# Patient Record
Sex: Male | Born: 1990 | Race: Black or African American | Hispanic: No | Marital: Single | Smoking: Current every day smoker
Health system: Southern US, Community
[De-identification: ages and names within clinical notes are randomized; demographics above are authoritative.]

## PROBLEM LIST (undated history)

## (undated) HISTORY — PX: APPENDECTOMY: SHX54

---

## 2019-03-17 ENCOUNTER — Other Ambulatory Visit: Payer: Self-pay

## 2019-03-17 ENCOUNTER — Encounter: Payer: Self-pay | Admitting: Emergency Medicine

## 2019-03-17 DIAGNOSIS — K920 Hematemesis: Secondary | ICD-10-CM | POA: Insufficient documentation

## 2019-03-17 DIAGNOSIS — Z5321 Procedure and treatment not carried out due to patient leaving prior to being seen by health care provider: Secondary | ICD-10-CM | POA: Insufficient documentation

## 2019-03-17 DIAGNOSIS — R1013 Epigastric pain: Secondary | ICD-10-CM | POA: Insufficient documentation

## 2019-03-17 LAB — CBC
HCT: 43.8 % (ref 39.0–52.0)
Hemoglobin: 15 g/dL (ref 13.0–17.0)
MCH: 30.9 pg (ref 26.0–34.0)
MCHC: 34.2 g/dL (ref 30.0–36.0)
MCV: 90.1 fL (ref 80.0–100.0)
Platelets: 213 10*3/uL (ref 150–400)
RBC: 4.86 MIL/uL (ref 4.22–5.81)
RDW: 11.8 % (ref 11.5–15.5)
WBC: 9.6 10*3/uL (ref 4.0–10.5)
nRBC: 0 % (ref 0.0–0.2)

## 2019-03-17 LAB — LIPASE, BLOOD: Lipase: 22 U/L (ref 11–51)

## 2019-03-17 LAB — COMPREHENSIVE METABOLIC PANEL
ALT: 12 U/L (ref 0–44)
AST: 15 U/L (ref 15–41)
Albumin: 3.9 g/dL (ref 3.5–5.0)
Alkaline Phosphatase: 47 U/L (ref 38–126)
Anion gap: 7 (ref 5–15)
BUN: 10 mg/dL (ref 6–20)
CO2: 25 mmol/L (ref 22–32)
Calcium: 9.1 mg/dL (ref 8.9–10.3)
Chloride: 101 mmol/L (ref 98–111)
Creatinine, Ser: 1.16 mg/dL (ref 0.61–1.24)
GFR calc Af Amer: >60 mL/min (ref >60)
GFR calc non Af Amer: >60 mL/min (ref >60)
Glucose, Bld: 93 mg/dL (ref 70–99)
Potassium: 3.4 mmol/L — ABNORMAL LOW (ref 3.5–5.1)
Sodium: 133 mmol/L — ABNORMAL LOW (ref 135–145)
Total Bilirubin: 1.6 mg/dL — ABNORMAL HIGH (ref 0.3–1.2)
Total Protein: 7.2 g/dL (ref 6.5–8.1)

## 2019-03-17 MED ORDER — SODIUM CHLORIDE 0.9% FLUSH: 3.0000 mL | Freq: Once | INTRAVENOUS | Status: DC

## 2019-03-17 MED ORDER — ONDANSETRON 4 MG PO TBDP
4.0000 mg | ORAL_TABLET | Freq: Once | ORAL | Status: AC | PRN
  Administered 2019-03-17: 4 mg via ORAL
  Filled 2019-03-17: qty 1

## 2019-03-17 MED ORDER — LIDOCAINE HCL 1 % IJ SOLN
0.50 | INTRAMUSCULAR | Status: DC
Start: ? — End: 2019-03-17

## 2019-03-17 NOTE — ED Triage Notes (Signed)
Pt presents to ER from home with complaints of epigastric pain for 4 days , accompanied by nausea and vomiting, reports last BM 3 days ago denies any diarrhea. Pt talks in complete sentences no respiratory distress noted

## 2019-03-18 ENCOUNTER — Emergency Department
Admission: EM | Admit: 2019-03-18 | Discharge: 2019-03-18 | Disposition: A | Discharge status: Home / Self Care | Payer: Self-pay | Attending: Emergency Medicine | Admitting: Emergency Medicine

## 2019-03-18 ENCOUNTER — Encounter (HOSPITAL_COMMUNITY): Payer: Self-pay

## 2019-03-18 ENCOUNTER — Emergency Department (HOSPITAL_COMMUNITY)
Admission: EM | Admit: 2019-03-18 | Discharge: 2019-03-18 | Disposition: A | Discharge status: Home / Self Care | Payer: Self-pay | Attending: Emergency Medicine | Admitting: Emergency Medicine

## 2019-03-18 DIAGNOSIS — R110 Nausea: Secondary | ICD-10-CM | POA: Insufficient documentation

## 2019-03-18 DIAGNOSIS — R1084 Generalized abdominal pain: Secondary | ICD-10-CM | POA: Insufficient documentation

## 2019-03-18 DIAGNOSIS — R531 Weakness: Secondary | ICD-10-CM | POA: Insufficient documentation

## 2019-03-18 DIAGNOSIS — K5900 Constipation, unspecified: Secondary | ICD-10-CM | POA: Insufficient documentation

## 2019-03-18 DIAGNOSIS — R112 Nausea with vomiting, unspecified: Secondary | ICD-10-CM | POA: Insufficient documentation

## 2019-03-18 DIAGNOSIS — F17210 Nicotine dependence, cigarettes, uncomplicated: Secondary | ICD-10-CM | POA: Insufficient documentation

## 2019-03-18 LAB — COMPREHENSIVE METABOLIC PANEL
ALT: 14 U/L (ref 0–44)
AST: 13 U/L — ABNORMAL LOW (ref 15–41)
Albumin: 4.1 g/dL (ref 3.5–5.0)
Alkaline Phosphatase: 45 U/L (ref 38–126)
Anion gap: 11 (ref 5–15)
BUN: 10 mg/dL (ref 6–20)
CO2: 22 mmol/L (ref 22–32)
Calcium: 9.3 mg/dL (ref 8.9–10.3)
Chloride: 101 mmol/L (ref 98–111)
Creatinine, Ser: 1.14 mg/dL (ref 0.61–1.24)
GFR calc Af Amer: >60 mL/min (ref >60)
GFR calc non Af Amer: >60 mL/min (ref >60)
Glucose, Bld: 95 mg/dL (ref 70–99)
Potassium: 3.2 mmol/L — ABNORMAL LOW (ref 3.5–5.1)
Sodium: 134 mmol/L — ABNORMAL LOW (ref 135–145)
Total Bilirubin: 1.2 mg/dL (ref 0.3–1.2)
Total Protein: 7.4 g/dL (ref 6.5–8.1)

## 2019-03-18 LAB — CBC
HCT: 44 % (ref 39.0–52.0)
Hemoglobin: 14.9 g/dL (ref 13.0–17.0)
MCH: 30.9 pg (ref 26.0–34.0)
MCHC: 33.9 g/dL (ref 30.0–36.0)
MCV: 91.3 fL (ref 80.0–100.0)
Platelets: 218 10*3/uL (ref 150–400)
RBC: 4.82 MIL/uL (ref 4.22–5.81)
RDW: 11.8 % (ref 11.5–15.5)
WBC: 10.6 10*3/uL — ABNORMAL HIGH (ref 4.0–10.5)
nRBC: 0 % (ref 0.0–0.2)

## 2019-03-18 LAB — LIPASE, BLOOD: Lipase: 19 U/L (ref 11–51)

## 2019-03-18 LAB — LACTIC ACID, PLASMA: Lactic Acid, Venous: 1.4 mmol/L (ref 0.5–1.9)

## 2019-03-18 MED ORDER — SODIUM CHLORIDE 0.9% FLUSH
3.0000 mL | Freq: Once | INTRAVENOUS | Status: AC
  Administered 2019-03-18: 3 mL via INTRAVENOUS

## 2019-03-18 MED ORDER — DIPHENHYDRAMINE HCL 50 MG/ML IJ SOLN
12.5000 mg | Freq: Once | INTRAMUSCULAR | Status: AC
  Administered 2019-03-18: 03:00:00 12.5 mg via INTRAVENOUS
  Filled 2019-03-18: qty 1

## 2019-03-18 MED ORDER — METOCLOPRAMIDE HCL 5 MG/ML IJ SOLN
10.0000 mg | Freq: Once | INTRAMUSCULAR | Status: AC
  Administered 2019-03-18: 10 mg via INTRAVENOUS
  Filled 2019-03-18: qty 2

## 2019-03-18 MED ORDER — SODIUM CHLORIDE 0.9 % IV BOLUS
1000.0000 mL | Freq: Once | INTRAVENOUS | Status: AC
  Administered 2019-03-18: 1000 mL via INTRAVENOUS

## 2019-03-18 NOTE — ED Triage Notes (Addendum)
Pt reports abdominal pain and vomiting x4 days. States that he recently had his appendix removed and there was a complication involving his large intestine. States that he has been unable to keep anything down. Denies diarrhea.

## 2019-03-18 NOTE — ED Provider Notes (Signed)
[facility]-EMERGENCY DEPT Provider Note   CSN: [id number] Arrival date & time: 03/18/19  0008     History Chief Complaint  Patient presents with  . Abdominal Pain    Ethan Schultz is a 29 y.o. male.  Patient to ED with complaint of epigastric and right sided abdominal pain, nausea, vomiting, and constipation for 4 days. "My mouth is so dry, I know I'm dehydrated." He reports no BM in 4 days. No fever. No SOB, CP. He reports decreased urination and decreased PO intake secondary to nausea. He states he had an appendectomy years ago in [place] during which there was a complication, possible injury to large bowel. He is concerned for bowel obstruction.   The history is provided by the patient. No language interpreter was used.  Abdominal Pain Associated symptoms: constipation, nausea and vomiting   Associated symptoms: no chest pain, no fever and no shortness of breath        History reviewed. No pertinent past medical history.  There are no problems to display for this patient.   Past Surgical History:  Procedure Laterality Date  . APPENDECTOMY         History reviewed. No pertinent family history.  Social History   Tobacco Use  . Smoking status: Current Every Day Smoker  Substance Use Topics  . Alcohol use: Not Currently  . Drug use: Not Currently    Home Medications Prior to Admission medications   Medication Sig Start Date End Date Taking? Authorizing Provider  ondansetron (ZOFRAN-ODT) 4 MG disintegrating tablet Take 4 mg by mouth every 8 (eight) hours as needed for nausea or vomiting.   Yes [provider]    Allergies    Penicillins and Orange oil  Review of Systems   Review of Systems  Constitutional: Negative for fever.  HENT: Negative.   Respiratory: Negative.  Negative for shortness of breath.   Cardiovascular: Negative for chest pain.  Gastrointestinal: Positive for abdominal pain, constipation, nausea and vomiting.   Genitourinary: Positive for decreased urine volume.  Musculoskeletal: Negative for back pain.  Neurological: Positive for weakness (Generalized).    Physical Exam Updated Vital Signs BP 133/88 (BP Location: Left Arm)   Pulse 99   Temp 98.2 F (36.8 C) (Oral)   Resp (!) 22   SpO2 100%   Physical Exam Vitals and nursing note reviewed.  Constitutional:      Appearance: He is well-developed.  HENT:     Head: Normocephalic.     Mouth/Throat:     Comments: Dry oral mucosa. Cardiovascular:     Rate and Rhythm: Normal rate and regular rhythm.     Heart sounds: No murmur.  Pulmonary:     Effort: Pulmonary effort is normal.     Breath sounds: Normal breath sounds. No wheezing, rhonchi or rales.  Abdominal:     General: Bowel sounds are normal. There is no distension.     Palpations: Abdomen is soft.     Tenderness: There is generalized abdominal tenderness. There is no right CVA tenderness, guarding or rebound.     Hernia: No hernia is present.     Comments: Well healed surgical scar in RLQ.  Musculoskeletal:        General: Normal range of motion.     Cervical back: Normal range of motion and neck supple.  Skin:    General: Skin is warm and dry.     Findings: No rash.  Neurological:     Mental  Status: He is alert and oriented to person, place, and time.     ED Results / Procedures / Treatments   Labs (all labs ordered are listed, but only abnormal results are displayed) Labs Reviewed  COMPREHENSIVE METABOLIC PANEL - Abnormal; Notable for the following components:      Result Value   Sodium 134 (*)    Potassium 3.2 (*)    AST 13 (*)    All other components within normal limits  CBC - Abnormal; Notable for the following components:   WBC 10.6 (*)    All other components within normal limits  LIPASE, BLOOD  URINALYSIS, ROUTINE W REFLEX MICROSCOPIC  LACTIC ACID, PLASMA  LACTIC ACID, PLASMA   Results for orders placed or performed during the hospital encounter of  03/18/19  Lipase, blood  Result Value Ref Range   Lipase 19 11 - 51 U/L  Comprehensive metabolic panel  Result Value Ref Range   Sodium 134 (L) 135 - 145 mmol/L   Potassium 3.2 (L) 3.5 - 5.1 mmol/L   Chloride 101 98 - 111 mmol/L   CO2 22 22 - 32 mmol/L   Glucose, Bld 95 70 - 99 mg/dL   BUN 10 6 - 20 mg/dL   Creatinine, Ser 1.14 0.61 - 1.24 mg/dL   Calcium 9.3 8.9 - 10.3 mg/dL   Total Protein 7.4 6.5 - 8.1 g/dL   Albumin 4.1 3.5 - 5.0 g/dL   AST 13 (L) 15 - 41 U/L   ALT 14 0 - 44 U/L   Alkaline Phosphatase 45 38 - 126 U/L   Total Bilirubin 1.2 0.3 - 1.2 mg/dL   GFR calc non Af Amer >60 >60 mL/min   GFR calc Af Amer >60 >60 mL/min   Anion gap 11 5 - 15  CBC  Result Value Ref Range   WBC 10.6 (H) 4.0 - 10.5 K/uL   RBC 4.82 4.22 - 5.81 MIL/uL   Hemoglobin 14.9 13.0 - 17.0 g/dL   HCT 44.0 39.0 - 52.0 %   MCV 91.3 80.0 - 100.0 fL   MCH 30.9 26.0 - 34.0 pg   MCHC 33.9 30.0 - 36.0 g/dL   RDW 11.8 11.5 - 15.5 %   Platelets 218 150 - 400 K/uL   nRBC 0.0 0.0 - 0.2 %  Lactic acid, plasma  Result Value Ref Range   Lactic Acid, Venous 1.4 0.5 - 1.9 mmol/L    EKG None  Radiology No results found.  Procedures Procedures (including critical care time)  Medications Ordered in ED Medications  sodium chloride 0.9 % bolus 1,000 mL (has no administration in time range)  metoCLOPramide (REGLAN) injection 10 mg (has no administration in time range)  diphenhydrAMINE (BENADRYL) injection 12.5 mg (has no administration in time range)  sodium chloride flush (NS) 0.9 % injection 3 mL (3 mLs Intravenous Given 03/18/19 0110)    ED Course  I have reviewed the triage vital signs and the nursing notes.  Pertinent labs & imaging results that were available during my care of the patient were reviewed by me and considered in my medical decision making (see chart for details).    MDM Rules/Calculators/A&P                      Patient to ED with abdominal pain, N, V, constipation as  detailed in HPI.   Chart reviewed: The patient has been seen at multiple [place] hospitals in the last 3 days: 03/15/19 - [facility] - CC abd pain, N, V, diarrhea, no decreased urination. Labs unremarkable, CT abd/pel w/o acute finding. Dx with Rx Phenergan  03/16/19 - [facility]: CC abd pain vomiting, w/bloody emesis. Labs unremarkable, CT abd/pel repeated and is negative. D/ch with Rx Bentyl, phenergan, prilosec. Given GI referral.  03/17/19 - [facility] - left prior to being seen and presented to [facility] for current encounter.   Patient given IV Reglan and benadryl. Labs are reassuring. No further imaging is felt indicated.   On re-evaluation the patient is resting comfortably. He is strongly encouraged to follow up with GI as has been recommended.    Final Clinical Impression(s) / ED Diagnoses Final diagnoses:  None   1. Generalized abdominal pain 2. nausea  Rx / DC Orders ED Discharge Orders    None       Elpidio Anis, PA-C 03/23/19 Jeannie Fend, MD 03/23/19 [phone]

## 2019-03-18 NOTE — Discharge Instructions (Addendum)
Take the medications you were previously prescribed and call the gastroenterologist you were referred to by [facility] on Monday to schedule an appointment.

## 2019-05-02 ENCOUNTER — Other Ambulatory Visit: Payer: Self-pay

## 2019-05-02 ENCOUNTER — Encounter: Payer: Self-pay | Admitting: Emergency Medicine

## 2019-05-02 ENCOUNTER — Emergency Department
Admission: EM | Admit: 2019-05-02 | Discharge: 2019-05-02 | Disposition: A | Discharge status: Home / Self Care | Payer: Self-pay | Attending: Emergency Medicine | Admitting: Emergency Medicine

## 2019-05-02 ENCOUNTER — Emergency Department: Payer: Self-pay

## 2019-05-02 DIAGNOSIS — R1115 Cyclical vomiting syndrome unrelated to migraine: Secondary | ICD-10-CM | POA: Insufficient documentation

## 2019-05-02 DIAGNOSIS — F17200 Nicotine dependence, unspecified, uncomplicated: Secondary | ICD-10-CM | POA: Insufficient documentation

## 2019-05-02 DIAGNOSIS — Z9089 Acquired absence of other organs: Secondary | ICD-10-CM | POA: Insufficient documentation

## 2019-05-02 DIAGNOSIS — R1013 Epigastric pain: Secondary | ICD-10-CM | POA: Insufficient documentation

## 2019-05-02 DIAGNOSIS — F1290 Cannabis use, unspecified, uncomplicated: Secondary | ICD-10-CM | POA: Insufficient documentation

## 2019-05-02 LAB — COMPREHENSIVE METABOLIC PANEL
ALT: 18 U/L (ref 0–44)
AST: 24 U/L (ref 15–41)
Albumin: 4.6 g/dL (ref 3.5–5.0)
Alkaline Phosphatase: 46 U/L (ref 38–126)
Anion gap: 14 (ref 5–15)
BUN: 16 mg/dL (ref 6–20)
CO2: 19 mmol/L — ABNORMAL LOW (ref 22–32)
Calcium: 9.5 mg/dL (ref 8.9–10.3)
Chloride: 108 mmol/L (ref 98–111)
Creatinine, Ser: 1.23 mg/dL (ref 0.61–1.24)
GFR calc Af Amer: >60 mL/min (ref >60)
GFR calc non Af Amer: >60 mL/min (ref >60)
Glucose, Bld: 179 mg/dL — ABNORMAL HIGH (ref 70–99)
Potassium: 3.7 mmol/L (ref 3.5–5.1)
Sodium: 141 mmol/L (ref 135–145)
Total Bilirubin: 0.6 mg/dL (ref 0.3–1.2)
Total Protein: 7.8 g/dL (ref 6.5–8.1)

## 2019-05-02 LAB — LIPASE, BLOOD: Lipase: 29 U/L (ref 11–51)

## 2019-05-02 LAB — URINE DRUG SCREEN, QUALITATIVE (ARMC ONLY)
Amphetamines, Ur Screen: NOT DETECTED
Barbiturates, Ur Screen: NOT DETECTED
Benzodiazepine, Ur Scrn: NOT DETECTED
Cannabinoid 50 Ng, Ur ~~LOC~~: POSITIVE — AB
Cocaine Metabolite,Ur ~~LOC~~: NOT DETECTED
MDMA (Ecstasy)Ur Screen: NOT DETECTED
Methadone Scn, Ur: NOT DETECTED
Opiate, Ur Screen: POSITIVE — AB
Phencyclidine (PCP) Ur S: NOT DETECTED
Tricyclic, Ur Screen: NOT DETECTED

## 2019-05-02 LAB — URINALYSIS, COMPLETE (UACMP) WITH MICROSCOPIC
Bacteria, UA: NONE SEEN
Bilirubin Urine: NEGATIVE
Glucose, UA: 50 mg/dL — AB
Hgb urine dipstick: NEGATIVE
Ketones, ur: 20 mg/dL — AB
Leukocytes,Ua: NEGATIVE
Nitrite: NEGATIVE
Protein, ur: 30 mg/dL — AB
Specific Gravity, Urine: 1.014 (ref 1.005–1.030)
pH: 9 — ABNORMAL HIGH (ref 5.0–8.0)

## 2019-05-02 LAB — CBC
HCT: 42.4 % (ref 39.0–52.0)
Hemoglobin: 14.4 g/dL (ref 13.0–17.0)
MCH: 31.4 pg (ref 26.0–34.0)
MCHC: 34 g/dL (ref 30.0–36.0)
MCV: 92.6 fL (ref 80.0–100.0)
Platelets: 255 10*3/uL (ref 150–400)
RBC: 4.58 MIL/uL (ref 4.22–5.81)
RDW: 11.3 % — ABNORMAL LOW (ref 11.5–15.5)
WBC: 9.1 10*3/uL (ref 4.0–10.5)
nRBC: 0 % (ref 0.0–0.2)

## 2019-05-02 MED ORDER — ONDANSETRON HCL 4 MG/2ML IJ SOLN
4.0000 mg | Freq: Once | INTRAMUSCULAR | Status: AC
  Administered 2019-05-02: 4 mg via INTRAVENOUS
  Filled 2019-05-02: qty 2

## 2019-05-02 MED ORDER — IOHEXOL 300 MG/ML  SOLN
100.0000 mL | Freq: Once | INTRAMUSCULAR | Status: AC | PRN
  Administered 2019-05-02: 100 mL via INTRAVENOUS

## 2019-05-02 MED ORDER — ONDANSETRON 4 MG PO TBDP: 4.0000 mg | ORAL_TABLET | Freq: Three times a day (TID) | ORAL | 0 refills | Status: DC | PRN

## 2019-05-02 MED ORDER — LACTATED RINGERS IV BOLUS
1000.0000 mL | Freq: Once | INTRAVENOUS | Status: AC
  Administered 2019-05-02: 1000 mL via INTRAVENOUS

## 2019-05-02 MED ORDER — SODIUM CHLORIDE 0.9% FLUSH
3.0000 mL | Freq: Once | INTRAVENOUS | Status: AC
  Administered 2019-05-02: 3 mL via INTRAVENOUS

## 2019-05-02 MED ORDER — DROPERIDOL 2.5 MG/ML IJ SOLN
2.5000 mg | Freq: Once | INTRAMUSCULAR | Status: AC
  Administered 2019-05-02: 2.5 mg via INTRAVENOUS
  Filled 2019-05-02: qty 2

## 2019-05-02 MED ORDER — MORPHINE SULFATE (PF) 4 MG/ML IV SOLN
4.0000 mg | Freq: Once | INTRAVENOUS | Status: AC
  Administered 2019-05-02: 4 mg via INTRAVENOUS
  Filled 2019-05-02: qty 1

## 2019-05-02 NOTE — ED Triage Notes (Signed)
Pt to ER via EMS from home with c/o acute onset abdominal pain.  Pt reports previous surgery with complications.  Pt reports chronic abdominal problems since that time.  Pt report n/v/d with pain at this time.  Pt with active vomiting on arrival. Dr. Larinda Buttery to room to assess pt.

## 2019-05-02 NOTE — ED Provider Notes (Signed)
[facility] Emergency Department Provider Note   ____________________________________________   First MD Initiated Contact with Patient 05/02/19 1106     (approximate)  I have reviewed the triage vital signs and the nursing notes.   HISTORY  Chief Complaint Abdominal Pain and Emesis    HPI Ethan Schultz is a 29 y.o. male with past medical history of recurrent abdominal pain presents to the ED complaining of abdominal pain, nausea, and vomiting.  History is limited due to ongoing vomiting.  Per EMS, patient woke up this morning with severe diffuse abdominal pain as well as multiple episodes of emesis.  Patient states he has not been able to keep anything down since this morning and he has severe pain across much of his abdomen, similar to prior episodes but more severe.  He does state he had an episode of diarrhea earlier today and has not noticed any blood in his emesis or stool.  He was feeling well yesterday with no recent fevers, cough, chest pain, or shortness of breath.  He attributes his episodes of abdominal pain to an appendectomy many years ago when he reports having a bowel injury.        History reviewed. No pertinent past medical history.  There are no problems to display for this patient.   Past Surgical History:  Procedure Laterality Date  . APPENDECTOMY      Prior to Admission medications   Medication Sig Start Date End Date Taking? Authorizing Provider  ondansetron (ZOFRAN ODT) 4 MG disintegrating tablet Take 1 tablet (4 mg total) by mouth every 8 (eight) hours as needed for nausea or vomiting. 05/02/19   Blake Divine, MD    Allergies Penicillins and Orange oil  History reviewed. No pertinent family history.  Social History Social History   Tobacco Use  . Smoking status: Current Every Day Smoker  . Smokeless tobacco: Never Used  Substance Use Topics  . Alcohol use: Not Currently  . Drug use: Not Currently    Review of  Systems  Constitutional: No fever/chills Eyes: No visual changes. ENT: No sore throat. Cardiovascular: Denies chest pain. Respiratory: Denies shortness of breath. Gastrointestinal: Positive for abdominal pain.  Positive for nausea and vomiting.  No diarrhea.  No constipation. Genitourinary: Negative for dysuria. Musculoskeletal: Negative for back pain. Skin: Negative for rash. Neurological: Negative for headaches, focal weakness or numbness.  ____________________________________________   PHYSICAL EXAM:  VITAL SIGNS: ED Triage Vitals  Enc Vitals Group     BP --      Pulse --      Resp --      Temp --      Temp src --      SpO2 --      Weight 05/02/19 1106 210 lb (95.3 kg)     Height 05/02/19 1106 5\' 10"  (1.778 m)     Head Circumference --      Peak Flow --      Pain Score 05/02/19 1105 10     Pain Loc --      Pain Edu? --      Excl. in [place]? --     Constitutional: Alert and oriented. Eyes: Conjunctivae are normal. Head: Atraumatic. Nose: No congestion/rhinnorhea. Mouth/Throat: Mucous membranes are moist. Neck: Normal ROM Cardiovascular: Tachycardic, regular rhythm. Grossly normal heart sounds. Respiratory: Normal respiratory effort.  No retractions. Lungs CTAB. Gastrointestinal: Soft and diffusely tender to palpation with no rebound or guarding. No distention. Genitourinary: deferred Musculoskeletal: No lower extremity  tenderness nor edema. Neurologic:  Normal speech and language. No gross focal neurologic deficits are appreciated. Skin:  Skin is warm, dry and intact. No rash noted. Psychiatric: Mood and affect are normal. Speech and behavior are normal.  ____________________________________________   LABS (all labs ordered are listed, but only abnormal results are displayed)  Labs Reviewed  COMPREHENSIVE METABOLIC PANEL - Abnormal; Notable for the following components:      Result Value   CO2 19 (*)    Glucose, Bld 179 (*)    All other components within  normal limits  CBC - Abnormal; Notable for the following components:   RDW 11.3 (*)    All other components within normal limits  URINALYSIS, COMPLETE (UACMP) WITH MICROSCOPIC - Abnormal; Notable for the following components:   Color, Urine YELLOW (*)    APPearance CLEAR (*)    pH 9.0 (*)    Glucose, UA 50 (*)    Ketones, ur 20 (*)    Protein, ur 30 (*)    All other components within normal limits  URINE DRUG SCREEN, QUALITATIVE ([facility] ONLY) - Abnormal; Notable for the following components:   Opiate, Ur Screen POSITIVE (*)    Cannabinoid 50 Ng, Ur [place] POSITIVE (*)    All other components within normal limits  LIPASE, BLOOD    PROCEDURES  Procedure(s) performed (including Critical Care):  Procedures   ED ECG REPORT I, Chesley Noon, the attending physician, personally viewed and interpreted this ECG.   Date: 05/02/2019  EKG Time: 11:12  Rate: 62  Rhythm: normal sinus rhythm  Axis: Normal  Intervals:none  ST&T Change: Benign early repolarization    ____________________________________________   INITIAL IMPRESSION / ASSESSMENT AND PLAN / ED COURSE       29 year old male with history of recurrent abdominal pain presents to the ED complaining of severe diffuse pain as well as persistent nausea and vomiting since waking up this morning.  Reviewing care everywhere, he has had ED visits for similar pain in January at [facility] and [facility] where CT and labs were unremarkable.  Given chronic complaints, we will hold off on CT scan for now and check labs, treat symptomatically.  Patient continues to have significant abdominal pain with active vomiting despite morphine and Zofran.  We will treat with droperidol and check CT scan of abdomen/pelvis.  CT scan is negative for acute process, vomiting has resolved following dose of droperidol, and patient states pain is improved.  He does admit to smoking marijuana regularly and I have advised him that this could be contributing to his  episodes of abdominal pain and vomiting.  He was counseled to establish care with a PCP and to return to the ED for new or worsening symptoms.  Patient agrees with plan.      ____________________________________________   FINAL CLINICAL IMPRESSION(S) / ED DIAGNOSES  Final diagnoses:  Epigastric pain  Cyclic vomiting syndrome     ED Discharge Orders         Ordered    ondansetron (ZOFRAN ODT) 4 MG disintegrating tablet  Every 8 hours PRN     05/02/19 1335           Note:  This document was prepared using Dragon voice recognition software and may include unintentional dictation errors.   Chesley Noon, MD 05/02/19 1359

## 2019-10-11 ENCOUNTER — Emergency Department: Payer: PRIVATE HEALTH INSURANCE

## 2019-10-11 ENCOUNTER — Other Ambulatory Visit: Payer: Self-pay

## 2019-10-11 ENCOUNTER — Encounter: Payer: Self-pay | Admitting: Radiology

## 2019-10-11 ENCOUNTER — Emergency Department
Admission: EM | Admit: 2019-10-11 | Discharge: 2019-10-11 | Disposition: A | Discharge status: Home / Self Care | Payer: PRIVATE HEALTH INSURANCE | Attending: Student in an Organized Health Care Education/Training Program | Admitting: Student in an Organized Health Care Education/Training Program

## 2019-10-11 DIAGNOSIS — R1084 Generalized abdominal pain: Secondary | ICD-10-CM | POA: Insufficient documentation

## 2019-10-11 DIAGNOSIS — Z79899 Other long term (current) drug therapy: Secondary | ICD-10-CM | POA: Insufficient documentation

## 2019-10-11 DIAGNOSIS — R112 Nausea with vomiting, unspecified: Secondary | ICD-10-CM | POA: Insufficient documentation

## 2019-10-11 DIAGNOSIS — F17200 Nicotine dependence, unspecified, uncomplicated: Secondary | ICD-10-CM | POA: Insufficient documentation

## 2019-10-11 LAB — URINE DRUG SCREEN, QUALITATIVE (ARMC ONLY)
Amphetamines, Ur Screen: NOT DETECTED
Barbiturates, Ur Screen: NOT DETECTED
Benzodiazepine, Ur Scrn: NOT DETECTED
Cannabinoid 50 Ng, Ur ~~LOC~~: POSITIVE — AB
Cocaine Metabolite,Ur ~~LOC~~: NOT DETECTED
MDMA (Ecstasy)Ur Screen: NOT DETECTED
Methadone Scn, Ur: NOT DETECTED
Opiate, Ur Screen: POSITIVE — AB
Phencyclidine (PCP) Ur S: NOT DETECTED
Tricyclic, Ur Screen: NOT DETECTED

## 2019-10-11 LAB — CBC WITH DIFFERENTIAL/PLATELET
Abs Immature Granulocytes: 0.02 10*3/uL (ref 0.00–0.07)
Basophils Absolute: 0 10*3/uL (ref 0.0–0.1)
Basophils Relative: 0 %
Eosinophils Absolute: 0 10*3/uL (ref 0.0–0.5)
Eosinophils Relative: 0 %
HCT: 40.7 % (ref 39.0–52.0)
Hemoglobin: 14 g/dL (ref 13.0–17.0)
Immature Granulocytes: 0 %
Lymphocytes Relative: 15 %
Lymphs Abs: 1.3 10*3/uL (ref 0.7–4.0)
MCH: 31.6 pg (ref 26.0–34.0)
MCHC: 34.4 g/dL (ref 30.0–36.0)
MCV: 91.9 fL (ref 80.0–100.0)
Monocytes Absolute: 0.3 10*3/uL (ref 0.1–1.0)
Monocytes Relative: 3 %
Neutro Abs: 7.4 10*3/uL (ref 1.7–7.7)
Neutrophils Relative %: 82 %
Platelets: 174 10*3/uL (ref 150–400)
RBC: 4.43 MIL/uL (ref 4.22–5.81)
RDW: 11.9 % (ref 11.5–15.5)
WBC: 9 10*3/uL (ref 4.0–10.5)
nRBC: 0 % (ref 0.0–0.2)

## 2019-10-11 LAB — COMPREHENSIVE METABOLIC PANEL
ALT: 10 U/L (ref 0–44)
AST: 19 U/L (ref 15–41)
Albumin: 3.8 g/dL (ref 3.5–5.0)
Alkaline Phosphatase: 42 U/L (ref 38–126)
Anion gap: 12 (ref 5–15)
BUN: 16 mg/dL (ref 6–20)
CO2: 21 mmol/L — ABNORMAL LOW (ref 22–32)
Calcium: 8.5 mg/dL — ABNORMAL LOW (ref 8.9–10.3)
Chloride: 105 mmol/L (ref 98–111)
Creatinine, Ser: 1 mg/dL (ref 0.61–1.24)
GFR calc Af Amer: >60 mL/min (ref >60)
GFR calc non Af Amer: >60 mL/min (ref >60)
Glucose, Bld: 191 mg/dL — ABNORMAL HIGH (ref 70–99)
Potassium: 3.6 mmol/L (ref 3.5–5.1)
Sodium: 138 mmol/L (ref 135–145)
Total Bilirubin: 1.1 mg/dL (ref 0.3–1.2)
Total Protein: 6.2 g/dL — ABNORMAL LOW (ref 6.5–8.1)

## 2019-10-11 LAB — URINALYSIS, COMPLETE (UACMP) WITH MICROSCOPIC
Bacteria, UA: NONE SEEN
Bilirubin Urine: NEGATIVE
Glucose, UA: 150 mg/dL — AB
Hgb urine dipstick: NEGATIVE
Ketones, ur: 20 mg/dL — AB
Leukocytes,Ua: NEGATIVE
Nitrite: NEGATIVE
Protein, ur: NEGATIVE mg/dL
Specific Gravity, Urine: 1.018 (ref 1.005–1.030)
Squamous Epithelial / HPF: NONE SEEN (ref 0–5)
pH: 8 (ref 5.0–8.0)

## 2019-10-11 LAB — LIPASE, BLOOD: Lipase: 26 U/L (ref 11–51)

## 2019-10-11 MED ORDER — SODIUM CHLORIDE 0.9 % IV BOLUS
1000.0000 mL | Freq: Once | INTRAVENOUS | Status: AC
  Administered 2019-10-11: 1000 mL via INTRAVENOUS

## 2019-10-11 MED ORDER — IOHEXOL 9 MG/ML PO SOLN: 500.0000 mL | ORAL | Status: DC

## 2019-10-11 MED ORDER — PROMETHAZINE HCL 25 MG/ML IJ SOLN
25.0000 mg | Freq: Four times a day (QID) | INTRAMUSCULAR | Status: DC | PRN
  Administered 2019-10-11: 25 mg via INTRAVENOUS
  Filled 2019-10-11 (×2): qty 1

## 2019-10-11 MED ORDER — LACTATED RINGERS IV BOLUS
1000.0000 mL | Freq: Once | INTRAVENOUS | Status: AC
  Administered 2019-10-11: 1000 mL via INTRAVENOUS

## 2019-10-11 MED ORDER — MORPHINE SULFATE (PF) 4 MG/ML IV SOLN
4.0000 mg | INTRAVENOUS | Status: DC | PRN
  Administered 2019-10-11 (×2): 4 mg via INTRAVENOUS
  Filled 2019-10-11 (×2): qty 1

## 2019-10-11 MED ORDER — IOHEXOL 300 MG/ML  SOLN
100.0000 mL | Freq: Once | INTRAMUSCULAR | Status: AC | PRN
  Administered 2019-10-11: 100 mL via INTRAVENOUS

## 2019-10-11 MED ORDER — PROMETHAZINE HCL 25 MG PO TABS: 25.0000 mg | ORAL_TABLET | Freq: Three times a day (TID) | ORAL | 0 refills | Status: DC | PRN

## 2019-10-11 NOTE — ED Notes (Signed)
Pt to CT

## 2019-10-11 NOTE — ED Provider Notes (Signed)
[facility] Emergency Department Provider Note    First MD Initiated Contact with Patient 10/11/19 [phone]     (approximate)  I have reviewed the triage vital signs and the nursing notes.   HISTORY  Chief Complaint Abdominal Pain    HPI Ethan Schultz is a 29 y.o. male presents to the ER with severe generalized abdominal pain is crampy in nature associated with nausea and vomiting.  Is nonbilious none bloody emesis.  Symptoms started around 4 AM this morning.  Denies any fevers.  No aggravating factors.  Denies any pain radiating through to his back.  He status post appendectomy several years ago.  He states that the pain feels just as severe as his appendicitis.    History reviewed. No pertinent past medical history. No family history on file. Past Surgical History:  Procedure Laterality Date  . APPENDECTOMY     There are no problems to display for this patient.     Prior to Admission medications   Medication Sig Start Date End Date Taking? Authorizing Provider  ondansetron (ZOFRAN ODT) 4 MG disintegrating tablet Take 1 tablet (4 mg total) by mouth every 8 (eight) hours as needed for nausea or vomiting. 05/02/19   Chesley Noon, MD  promethazine (PHENERGAN) 25 MG tablet Take 1 tablet (25 mg total) by mouth every 8 (eight) hours as needed for nausea or vomiting. 10/11/19   Willy Eddy, MD    Allergies Penicillins and Orange oil    Social History Social History   Tobacco Use  . Smoking status: Current Every Day Smoker  . Smokeless tobacco: Never Used  Substance Use Topics  . Alcohol use: Not Currently  . Drug use: Not Currently    Review of Systems Patient denies headaches, rhinorrhea, blurry vision, numbness, shortness of breath, chest pain, edema, cough, abdominal pain, nausea, vomiting, diarrhea, dysuria, fevers, rashes or hallucinations unless otherwise stated above in HPI. ____________________________________________   PHYSICAL  EXAM:  VITAL SIGNS: Vitals:   10/11/19 1030 10/11/19 1050  BP: 109/90 (!) 97/57  Pulse: 63 (!) 57  Resp:    Temp:    SpO2: 98% 98%    Constitutional: Alert and oriented.  Eyes: Conjunctivae are normal.  Head: Atraumatic. Nose: No congestion/rhinnorhea. Mouth/Throat: Mucous membranes are moist.   Neck: No stridor. Painless ROM.  Cardiovascular: Normal rate, regular rhythm. Grossly normal heart sounds.  Good peripheral circulation. Respiratory: Normal respiratory effort.  No retractions. Lungs CTAB. Gastrointestinal: Soft with generalized ttp. No distention. No abdominal bruits. No CVA tenderness. Genitourinary:  Musculoskeletal: No lower extremity tenderness nor edema.  No joint effusions. Neurologic:  Normal speech and language. No gross focal neurologic deficits are appreciated. No facial droop Skin:  Skin is warm, dry and intact. No rash noted. Psychiatric: Mood and affect are normal. Speech and behavior are normal.  ____________________________________________   LABS (all labs ordered are listed, but only abnormal results are displayed)  Results for orders placed or performed during the hospital encounter of 10/11/19 (from the past 24 hour(s))  CBC with Differential/Platelet     Status: None   Collection Time: 10/11/19  8:32 AM  Result Value Ref Range   WBC 9.0 4.0 - 10.5 K/uL   RBC 4.43 4.22 - 5.81 MIL/uL   Hemoglobin 14.0 13.0 - 17.0 g/dL   HCT 03.5 39 - 52 %   MCV 91.9 80.0 - 100.0 fL   MCH 31.6 26.0 - 34.0 pg   MCHC 34.4 30.0 - 36.0 g/dL  RDW 11.9 11.5 - 15.5 %   Platelets 174 150 - 400 K/uL   nRBC 0.0 0.0 - 0.2 %   Neutrophils Relative % 82 %   Neutro Abs 7.4 1.7 - 7.7 K/uL   Lymphocytes Relative 15 %   Lymphs Abs 1.3 0.7 - 4.0 K/uL   Monocytes Relative 3 %   Monocytes Absolute 0.3 0 - 1 K/uL   Eosinophils Relative 0 %   Eosinophils Absolute 0.0 0 - 0 K/uL   Basophils Relative 0 %   Basophils Absolute 0.0 0 - 0 K/uL   Immature Granulocytes 0 %   Abs  Immature Granulocytes 0.02 0.00 - 0.07 K/uL  Comprehensive metabolic panel     Status: Abnormal   Collection Time: 10/11/19  8:32 AM  Result Value Ref Range   Sodium 138 135 - 145 mmol/L   Potassium 3.6 3.5 - 5.1 mmol/L   Chloride 105 98 - 111 mmol/L   CO2 21 (L) 22 - 32 mmol/L   Glucose, Bld 191 (H) 70 - 99 mg/dL   BUN 16 6 - 20 mg/dL   Creatinine, Ser 8.10 0.61 - 1.24 mg/dL   Calcium 8.5 (L) 8.9 - 10.3 mg/dL   Total Protein 6.2 (L) 6.5 - 8.1 g/dL   Albumin 3.8 3.5 - 5.0 g/dL   AST 19 15 - 41 U/L   ALT 10 0 - 44 U/L   Alkaline Phosphatase 42 38 - 126 U/L   Total Bilirubin 1.1 0.3 - 1.2 mg/dL   GFR calc non Af Amer >60 >60 mL/min   GFR calc Af Amer >60 >60 mL/min   Anion gap 12 5 - 15  Lipase, blood     Status: None   Collection Time: 10/11/19  8:32 AM  Result Value Ref Range   Lipase 26 11 - 51 U/L  Urine Drug Screen, Qualitative ([facility] only)     Status: Abnormal   Collection Time: 10/11/19 10:02 AM  Result Value Ref Range   Tricyclic, Ur Screen NONE DETECTED NONE DETECTED   Amphetamines, Ur Screen NONE DETECTED NONE DETECTED   MDMA (Ecstasy)Ur Screen NONE DETECTED NONE DETECTED   Cocaine Metabolite,Ur [place] NONE DETECTED NONE DETECTED   Opiate, Ur Screen POSITIVE (A) NONE DETECTED   Phencyclidine (PCP) Ur S NONE DETECTED NONE DETECTED   Cannabinoid 50 Ng, Ur [place] POSITIVE (A) NONE DETECTED   Barbiturates, Ur Screen NONE DETECTED NONE DETECTED   Benzodiazepine, Ur Scrn NONE DETECTED NONE DETECTED   Methadone Scn, Ur NONE DETECTED NONE DETECTED  Urinalysis, Complete w Microscopic     Status: Abnormal   Collection Time: 10/11/19 10:02 AM  Result Value Ref Range   Color, Urine STRAW (A) YELLOW   APPearance CLEAR (A) CLEAR   Specific Gravity, Urine 1.018 1.005 - 1.030   pH 8.0 5.0 - 8.0   Glucose, UA 150 (A) NEGATIVE mg/dL   Hgb urine dipstick NEGATIVE NEGATIVE   Bilirubin Urine NEGATIVE NEGATIVE   Ketones, ur 20 (A) NEGATIVE mg/dL   Protein, ur NEGATIVE NEGATIVE mg/dL    Nitrite NEGATIVE NEGATIVE   Leukocytes,Ua NEGATIVE NEGATIVE   RBC / HPF 0-5 0 - 5 RBC/hpf   WBC, UA 0-5 0 - 5 WBC/hpf   Bacteria, UA NONE SEEN NONE SEEN   Squamous Epithelial / LPF NONE SEEN 0 - 5   Mucus PRESENT    __________________________________________________________________  RADIOLOGY  I personally reviewed all radiographic images ordered to evaluate for the above acute complaints and reviewed radiology reports  and findings.  These findings were personally discussed with the patient.  Please see medical record for radiology report.  ____________________________________________   PROCEDURES  Procedure(s) performed:  Procedures    Critical Care performed: no ____________________________________________   INITIAL IMPRESSION / ASSESSMENT AND PLAN / ED COURSE  Pertinent labs & imaging results that were available during my care of the patient were reviewed by me and considered in my medical decision making (see chart for details).   DDX: Enteritis, gastritis, SBO, perforation, colitis, pancreatitis, biliary pathology, cyclic vomiting, gastroparesis  Ethan Schultz is a 29 y.o. who presents to the ED with symptoms as described above.  Patient uncomfortable appearing dry heaving in the hallway.  Will give IV fluids order blood work.  Based on his pain and past surgical history will order CT imaging to further evaluate.  I have ordered IV narcotic medication as well as IV Phenergan for antiemetic  Clinical Course as of Oct 11 1130  Thu Oct 11, 2019  1129 Patient's work-up is reassuring.  CT imaging without any evidence of acute intra-abdominal process.  May have a component of early colitis though given lack of fever white count or diarrheal symptoms have a lower suspicion for this.  He is improving with IV fluids.  No longer having any vomiting.  Has been seen for similar symptoms in the past improved with IV fluids and medications in the ER.  At this point do believe he stable and  appropriate for outpatient follow-up.   [PR]    Clinical Course User Index [PR] Willy Eddy, MD    The patient was evaluated in Emergency Department today for the symptoms described in the history of present illness. He/she was evaluated in the context of the global COVID-19 pandemic, which necessitated consideration that the patient might be at risk for infection with the SARS-CoV-2 virus that causes COVID-19. Institutional protocols and algorithms that pertain to the evaluation of patients at risk for COVID-19 are in a state of rapid change based on information released by regulatory bodies including the CDC and federal and state organizations. These policies and algorithms were followed during the patient's care in the ED.  As part of my medical decision making, I reviewed the following data within the electronic MEDICAL RECORD NUMBER Nursing notes reviewed and incorporated, Labs reviewed, notes from prior ED visits and [place] Controlled Substance Database   ____________________________________________   FINAL CLINICAL IMPRESSION(S) / ED DIAGNOSES  Final diagnoses:  Generalized abdominal pain  Non-intractable vomiting with nausea, unspecified vomiting type      NEW MEDICATIONS STARTED DURING THIS VISIT:  New Prescriptions   PROMETHAZINE (PHENERGAN) 25 MG TABLET    Take 1 tablet (25 mg total) by mouth every 8 (eight) hours as needed for nausea or vomiting.     Note:  This document was prepared using Dragon voice recognition software and may include unintentional dictation errors.    Willy Eddy, MD 10/11/19 [phone]

## 2019-10-11 NOTE — ED Triage Notes (Signed)
Pt arives via ems from home c/o rt lower quad pain, pt reported to ems that he has had his appendix removed in the past but this pain reminds him of that, pt is nauseated and vomiting, pt was given 4mg  of zofran and started on fluids by ems, pt denies hx of kidney stones

## 2019-10-11 NOTE — Discharge Instructions (Addendum)

## 2019-10-11 NOTE — ED Notes (Signed)
This RN providing DC documents to pt. Pt using foul language and yelling at this RN asking to see EDP. EDP made aware.

## 2019-10-11 NOTE — ED Notes (Signed)
EDP Roxan Hockey at bedside addressing pt's questions.

## 2019-10-13 ENCOUNTER — Emergency Department: Payer: Self-pay

## 2019-10-13 ENCOUNTER — Emergency Department
Admission: EM | Admit: 2019-10-13 | Discharge: 2019-10-13 | Disposition: A | Discharge status: Home / Self Care | Payer: Self-pay | Attending: Emergency Medicine | Admitting: Emergency Medicine

## 2019-10-13 ENCOUNTER — Other Ambulatory Visit: Payer: Self-pay

## 2019-10-13 DIAGNOSIS — R1115 Cyclical vomiting syndrome unrelated to migraine: Secondary | ICD-10-CM | POA: Insufficient documentation

## 2019-10-13 DIAGNOSIS — R1013 Epigastric pain: Secondary | ICD-10-CM | POA: Insufficient documentation

## 2019-10-13 DIAGNOSIS — R109 Unspecified abdominal pain: Secondary | ICD-10-CM

## 2019-10-13 DIAGNOSIS — F17200 Nicotine dependence, unspecified, uncomplicated: Secondary | ICD-10-CM | POA: Insufficient documentation

## 2019-10-13 LAB — URINALYSIS, COMPLETE (UACMP) WITH MICROSCOPIC
Bacteria, UA: NONE SEEN
Bilirubin Urine: NEGATIVE
Glucose, UA: NEGATIVE mg/dL
Hgb urine dipstick: NEGATIVE
Ketones, ur: 20 mg/dL — AB
Leukocytes,Ua: NEGATIVE
Nitrite: NEGATIVE
Protein, ur: NEGATIVE mg/dL
Specific Gravity, Urine: 1.017 (ref 1.005–1.030)
Squamous Epithelial / HPF: NONE SEEN (ref 0–5)
pH: 9 — ABNORMAL HIGH (ref 5.0–8.0)

## 2019-10-13 LAB — URINE DRUG SCREEN, QUALITATIVE (ARMC ONLY)
Amphetamines, Ur Screen: NOT DETECTED
Barbiturates, Ur Screen: NOT DETECTED
Benzodiazepine, Ur Scrn: NOT DETECTED
Cannabinoid 50 Ng, Ur ~~LOC~~: POSITIVE — AB
Cocaine Metabolite,Ur ~~LOC~~: NOT DETECTED
MDMA (Ecstasy)Ur Screen: NOT DETECTED
Methadone Scn, Ur: NOT DETECTED
Opiate, Ur Screen: POSITIVE — AB
Phencyclidine (PCP) Ur S: NOT DETECTED
Tricyclic, Ur Screen: NOT DETECTED

## 2019-10-13 LAB — COMPREHENSIVE METABOLIC PANEL
ALT: 13 U/L (ref 0–44)
AST: 20 U/L (ref 15–41)
Albumin: 4 g/dL (ref 3.5–5.0)
Alkaline Phosphatase: 39 U/L (ref 38–126)
Anion gap: 13 (ref 5–15)
BUN: 9 mg/dL (ref 6–20)
CO2: 21 mmol/L — ABNORMAL LOW (ref 22–32)
Calcium: 9 mg/dL (ref 8.9–10.3)
Chloride: 102 mmol/L (ref 98–111)
Creatinine, Ser: 1.04 mg/dL (ref 0.61–1.24)
GFR calc Af Amer: >60 mL/min (ref >60)
GFR calc non Af Amer: >60 mL/min (ref >60)
Glucose, Bld: 89 mg/dL (ref 70–99)
Potassium: 3.1 mmol/L — ABNORMAL LOW (ref 3.5–5.1)
Sodium: 136 mmol/L (ref 135–145)
Total Bilirubin: 1.4 mg/dL — ABNORMAL HIGH (ref 0.3–1.2)
Total Protein: 6.8 g/dL (ref 6.5–8.1)

## 2019-10-13 LAB — CBC WITH DIFFERENTIAL/PLATELET
Abs Immature Granulocytes: 0.03 10*3/uL (ref 0.00–0.07)
Basophils Absolute: 0 10*3/uL (ref 0.0–0.1)
Basophils Relative: 0 %
Eosinophils Absolute: 0 10*3/uL (ref 0.0–0.5)
Eosinophils Relative: 0 %
HCT: 39.9 % (ref 39.0–52.0)
Hemoglobin: 14.1 g/dL (ref 13.0–17.0)
Immature Granulocytes: 0 %
Lymphocytes Relative: 18 %
Lymphs Abs: 2 10*3/uL (ref 0.7–4.0)
MCH: 31.8 pg (ref 26.0–34.0)
MCHC: 35.3 g/dL (ref 30.0–36.0)
MCV: 89.9 fL (ref 80.0–100.0)
Monocytes Absolute: 0.7 10*3/uL (ref 0.1–1.0)
Monocytes Relative: 6 %
Neutro Abs: 8.3 10*3/uL — ABNORMAL HIGH (ref 1.7–7.7)
Neutrophils Relative %: 76 %
Platelets: 226 10*3/uL (ref 150–400)
RBC: 4.44 MIL/uL (ref 4.22–5.81)
RDW: 11.9 % (ref 11.5–15.5)
WBC: 10.9 10*3/uL — ABNORMAL HIGH (ref 4.0–10.5)
nRBC: 0 % (ref 0.0–0.2)

## 2019-10-13 LAB — LIPASE, BLOOD: Lipase: 19 U/L (ref 11–51)

## 2019-10-13 MED ORDER — DROPERIDOL 2.5 MG/ML IJ SOLN
2.5000 mg | Freq: Once | INTRAMUSCULAR | Status: AC
  Administered 2019-10-13: 2.5 mg via INTRAVENOUS
  Filled 2019-10-13: qty 2

## 2019-10-13 MED ORDER — PROCHLORPERAZINE MALEATE 10 MG PO TABS: 10.0000 mg | ORAL_TABLET | Freq: Four times a day (QID) | ORAL | 0 refills | Status: DC | PRN

## 2019-10-13 MED ORDER — POTASSIUM CHLORIDE CRYS ER 20 MEQ PO TBCR
40.0000 meq | EXTENDED_RELEASE_TABLET | Freq: Once | ORAL | Status: AC
  Administered 2019-10-13: 40 meq via ORAL
  Filled 2019-10-13: qty 2

## 2019-10-13 NOTE — ED Notes (Signed)
Pt refused vital signs.

## 2019-10-13 NOTE — ED Triage Notes (Addendum)
Pt arrives via EMS from home after having RLQ pain- pt seen here on Thursday for same with normal workup- pt yelling in pain and will not stay still- pt has had his appendix removed- pt states he has had a procedure where they "knicked his intestines" but cannot tell us what procedure

## 2019-10-13 NOTE — ED Notes (Signed)
Pt c/o sever RUQ pain that started this morning and N/V X3 days. Pt is AOX4, writhing in bed. Pt states his hands are numb- pt asked to slow his breathing down. Pt reports he was prescribed promethazine but hasn't been able to keep anything down for three days.

## 2019-10-13 NOTE — ED Notes (Signed)
Pt mother called and stated that pt had called her and told her that he was in the lobby and no one had seen him and that he has been yelling out in pain and was in the floor balled up in pain.  Pt mother informed that pt has been triaged and that pt was placed in a wheelchair. Pt placed himself in the floor, however, this RN has been sitting in the lobby and the patient has not been yelling out in pain at anytime. Pt mother informed that we are waiting on a room for pt to see MD. Pt mother verbalized understanding.

## 2019-10-13 NOTE — ED Provider Notes (Signed)
[facility] Emergency Department Provider Note   ____________________________________________   First MD Initiated Contact with Patient 10/13/19 1737     (approximate)  I have reviewed the triage vital signs and the nursing notes.   HISTORY  Chief Complaint Abdominal Pain    HPI Ethan Schultz is a 29 y.o. male with past medical history of appendectomy who presents to the ED complaining of abdominal pain.  Patient reports he has been dealing with constant stabbing pain in his epigastrium for the past 4 to 5 days.  It is been associated with nausea and vomiting and he states he has been unable to tolerate any liquids or solids for the past couple of days.  He denies any associated fevers and has not had any changes in his bowel movements.  He was seen in the ED for the symptoms 2 days ago, 1 work-up including CT scan was unremarkable.  He was then evaluated in the [facility] emergency department yesterday, when CT scan was again unremarkable.  He was prescribed Phenergan but states he vomits up the medication when he tries to take it.  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        History reviewed. No pertinent past medical history.  There are no problems to display for this patient.   Past Surgical History:  Procedure Laterality Date  . APPENDECTOMY      Prior to Admission medications   Medication Sig Start Date End Date Taking? Authorizing Provider  ondansetron (ZOFRAN ODT) 4 MG disintegrating tablet Take 1 tablet (4 mg total) by mouth every 8 (eight) hours as needed for nausea or vomiting. 05/02/19   Chesley Noon, MD  prochlorperazine (COMPAZINE) 10 MG tablet Take 1 tablet (10 mg total) by mouth every 6 (six) hours as needed for nausea or vomiting. 10/13/19   Chesley Noon, MD  promethazine (PHENERGAN) 25 MG tablet Take 1 tablet (25 mg total) by mouth  every 8 (eight) hours as needed for nausea or vomiting. 10/11/19   Willy Eddy, MD    Allergies Penicillins and Orange oil  History reviewed. No pertinent family history.  Social History Social History   Tobacco Use  . Smoking status: Current Every Day Smoker  . Smokeless tobacco: Never Used  Substance Use Topics  . Alcohol use: Not Currently  . Drug use: Not Currently    Review of Systems  Constitutional: No fever/chills Eyes: No visual changes. ENT: No sore throat. Cardiovascular: Denies chest pain. Respiratory: Denies shortness of breath. Gastrointestinal: Positive for abdominal pain, nausea, and vomiting.  No diarrhea.  No constipation. Genitourinary: Negative for dysuria. Musculoskeletal: Negative for back pain. Skin: Negative for rash. Neurological: Negative for headaches, focal weakness or numbness.  ____________________________________________   PHYSICAL EXAM:  VITAL SIGNS: ED Triage Vitals  Enc Vitals Group     BP 10/13/19 0827 113/64     Pulse Rate 10/13/19 0825 (!) 118     Resp 10/13/19 0825 20     Temp 10/13/19 0827 97.9 F (36.6 C)     Temp Source 10/13/19 0827 Oral     SpO2 10/13/19 0825 99 %     Weight 10/13/19 0826 187 lb 13.3 oz (85.2 kg)     Height 10/13/19 0826 5\' 10"  (1.778 m)     Head Circumference --      Peak Flow --  Pain Score 10/13/19 0826 10     Pain Loc --      Pain Edu? --      Excl. in GC? --     Constitutional: Alert and oriented. Eyes: Conjunctivae are normal. Head: Atraumatic. Nose: No congestion/rhinnorhea. Mouth/Throat: Mucous membranes are moist. Neck: Normal ROM Cardiovascular: Normal rate, regular rhythm. Grossly normal heart sounds. Respiratory: Normal respiratory effort.  No retractions. Lungs CTAB. Gastrointestinal: Soft and tender to palpation in the epigastrium and right upper quadrant. No distention. Genitourinary: deferred Musculoskeletal: No lower extremity tenderness nor edema. Neurologic:   Normal speech and language. No gross focal neurologic deficits are appreciated. Skin:  Skin is warm, dry and intact. No rash noted. Psychiatric: Mood and affect are normal. Speech and behavior are normal.  ____________________________________________   LABS (all labs ordered are listed, but only abnormal results are displayed)  Labs Reviewed  CBC WITH DIFFERENTIAL/PLATELET - Abnormal; Notable for the following components:      Result Value   WBC 10.9 (*)    Neutro Abs 8.3 (*)    All other components within normal limits  COMPREHENSIVE METABOLIC PANEL - Abnormal; Notable for the following components:   Potassium 3.1 (*)    CO2 21 (*)    Total Bilirubin 1.4 (*)    All other components within normal limits  URINALYSIS, COMPLETE (UACMP) WITH MICROSCOPIC - Abnormal; Notable for the following components:   Color, Urine YELLOW (*)    APPearance CLEAR (*)    pH 9.0 (*)    Ketones, ur 20 (*)    All other components within normal limits  LIPASE, BLOOD  URINE DRUG SCREEN, QUALITATIVE ([facility] ONLY)   ____________________________________________  EKG  ED ECG REPORT I, Chesley Noon, the attending physician, personally viewed and interpreted this ECG.   Date: 10/13/2019  EKG Time: 18:34  Rate: 53  Rhythm: sinus bradycardia  Axis: Normal  Intervals:none  ST&T Change: None   PROCEDURES  Procedure(s) performed (including Critical Care):  Procedures   ____________________________________________   INITIAL IMPRESSION / ASSESSMENT AND PLAN / ED COURSE       29 year old male with past medical history of appendectomy presents to the ED with 3 to 4 days of worsening abdominal pain associated with nausea and vomiting.  Pain initially started in his right lower quadrant but he now primarily points towards his epigastrium and right upper quadrant.  He has had a CT scan of his abdomen and pelvis each of the last 2 days which have been unremarkable on each occasion.  We will screen labs  and check right upper quadrant ultrasound.  Presentation appears most consistent with cyclic vomiting syndrome, he denies recent marijuana use.  We will treat with droperidol, QTC within normal limits on EKG today.  Lab work and right upper quadrant ultrasound are unremarkable.  Patient reports improvement in symptoms following dose of droperidol.  I suspect his symptoms are due to cyclic vomiting syndrome given his recurrent similar visits to various ED's.  He has tolerated p.o. here in the ED and is appropriate for discharge home, counseled to establish care with PCP and return to the ED for new or worsening symptoms.  Patient agrees with plan.      ____________________________________________   FINAL CLINICAL IMPRESSION(S) / ED DIAGNOSES  Final diagnoses:  Abdominal pain  Epigastric pain  Cyclic vomiting syndrome     ED Discharge Orders         Ordered    prochlorperazine (COMPAZINE) 10 MG tablet  Every  6 hours PRN        10/13/19 2033           Note:  This document was prepared using Dragon voice recognition software and may include unintentional dictation errors.   Chesley Noon, MD 10/13/19 2034

## 2021-04-30 ENCOUNTER — Other Ambulatory Visit: Payer: Self-pay

## 2021-04-30 ENCOUNTER — Emergency Department
Admission: EM | Admit: 2021-04-30 | Discharge: 2021-04-30 | Disposition: A | Discharge status: Home / Self Care | Payer: Self-pay | Attending: Student in an Organized Health Care Education/Training Program | Admitting: Student in an Organized Health Care Education/Training Program

## 2021-04-30 ENCOUNTER — Emergency Department: Payer: Self-pay

## 2021-04-30 DIAGNOSIS — R824 Acetonuria: Secondary | ICD-10-CM | POA: Insufficient documentation

## 2021-04-30 DIAGNOSIS — R001 Bradycardia, unspecified: Secondary | ICD-10-CM | POA: Insufficient documentation

## 2021-04-30 DIAGNOSIS — R197 Diarrhea, unspecified: Secondary | ICD-10-CM | POA: Insufficient documentation

## 2021-04-30 DIAGNOSIS — R112 Nausea with vomiting, unspecified: Secondary | ICD-10-CM | POA: Insufficient documentation

## 2021-04-30 DIAGNOSIS — R1084 Generalized abdominal pain: Secondary | ICD-10-CM | POA: Insufficient documentation

## 2021-04-30 DIAGNOSIS — R809 Proteinuria, unspecified: Secondary | ICD-10-CM | POA: Insufficient documentation

## 2021-04-30 LAB — COMPREHENSIVE METABOLIC PANEL
ALT: 26 U/L (ref 0–44)
AST: 44 U/L — ABNORMAL HIGH (ref 15–41)
Albumin: 3.7 g/dL (ref 3.5–5.0)
Alkaline Phosphatase: 38 U/L (ref 38–126)
Anion gap: 12 (ref 5–15)
BUN: 17 mg/dL (ref 6–20)
CO2: 24 mmol/L (ref 22–32)
Calcium: 9 mg/dL (ref 8.9–10.3)
Chloride: 105 mmol/L (ref 98–111)
Creatinine, Ser: 1.2 mg/dL (ref 0.61–1.24)
GFR, Estimated: >60 mL/min (ref >60)
Glucose, Bld: 134 mg/dL — ABNORMAL HIGH (ref 70–99)
Potassium: 4 mmol/L (ref 3.5–5.1)
Sodium: 141 mmol/L (ref 135–145)
Total Bilirubin: 1 mg/dL (ref 0.3–1.2)
Total Protein: 6 g/dL — ABNORMAL LOW (ref 6.5–8.1)

## 2021-04-30 LAB — URINE DRUG SCREEN, QUALITATIVE (ARMC ONLY)
Amphetamines, Ur Screen: NOT DETECTED
Barbiturates, Ur Screen: NOT DETECTED
Benzodiazepine, Ur Scrn: NOT DETECTED
Cannabinoid 50 Ng, Ur ~~LOC~~: POSITIVE — AB
Cocaine Metabolite,Ur ~~LOC~~: NOT DETECTED
MDMA (Ecstasy)Ur Screen: NOT DETECTED
Methadone Scn, Ur: NOT DETECTED
Opiate, Ur Screen: NOT DETECTED
Phencyclidine (PCP) Ur S: NOT DETECTED
Tricyclic, Ur Screen: NOT DETECTED

## 2021-04-30 LAB — LIPASE, BLOOD: Lipase: 25 U/L (ref 11–51)

## 2021-04-30 LAB — URINALYSIS, ROUTINE W REFLEX MICROSCOPIC
Bacteria, UA: NONE SEEN
Bilirubin Urine: NEGATIVE
Glucose, UA: NEGATIVE mg/dL
Hgb urine dipstick: NEGATIVE
Ketones, ur: 80 mg/dL — AB
Leukocytes,Ua: NEGATIVE
Nitrite: NEGATIVE
Protein, ur: 30 mg/dL — AB
Specific Gravity, Urine: 1.024 (ref 1.005–1.030)
Squamous Epithelial / HPF: NONE SEEN (ref 0–5)
pH: 9 — ABNORMAL HIGH (ref 5.0–8.0)

## 2021-04-30 LAB — CBC
HCT: 42.8 % (ref 39.0–52.0)
Hemoglobin: 14.3 g/dL (ref 13.0–17.0)
MCH: 31.4 pg (ref 26.0–34.0)
MCHC: 33.4 g/dL (ref 30.0–36.0)
MCV: 93.9 fL (ref 80.0–100.0)
Platelets: 225 10*3/uL (ref 150–400)
RBC: 4.56 MIL/uL (ref 4.22–5.81)
RDW: 11.7 % (ref 11.5–15.5)
WBC: 10.3 10*3/uL (ref 4.0–10.5)
nRBC: 0 % (ref 0.0–0.2)

## 2021-04-30 MED ORDER — ONDANSETRON HCL 4 MG/2ML IJ SOLN
4.0000 mg | Freq: Once | INTRAMUSCULAR | Status: AC
  Administered 2021-04-30: 4 mg via INTRAVENOUS
  Filled 2021-04-30: qty 2

## 2021-04-30 MED ORDER — LACTATED RINGERS IV BOLUS
1000.0000 mL | Freq: Once | INTRAVENOUS | Status: AC
  Administered 2021-04-30: 1000 mL via INTRAVENOUS

## 2021-04-30 MED ORDER — CAPSAICIN 0.025 % EX CREA
TOPICAL_CREAM | Freq: Once | CUTANEOUS | Status: AC
  Filled 2021-04-30: qty 60

## 2021-04-30 MED ORDER — FENTANYL CITRATE PF 50 MCG/ML IJ SOSY
50.0000 ug | PREFILLED_SYRINGE | Freq: Once | INTRAMUSCULAR | Status: AC
  Administered 2021-04-30: 50 ug via INTRAVENOUS
  Filled 2021-04-30: qty 1

## 2021-04-30 MED ORDER — IOHEXOL 300 MG/ML  SOLN
100.0000 mL | Freq: Once | INTRAMUSCULAR | Status: AC | PRN
  Administered 2021-04-30: 100 mL via INTRAVENOUS
  Filled 2021-04-30: qty 100

## 2021-04-30 MED ORDER — HALOPERIDOL LACTATE 5 MG/ML IJ SOLN: 5.0000 mg | Freq: Once | INTRAMUSCULAR | Status: DC

## 2021-04-30 MED ORDER — SODIUM CHLORIDE 0.9 % IV BOLUS
1000.0000 mL | Freq: Once | INTRAVENOUS | Status: AC
  Administered 2021-04-30: 1000 mL via INTRAVENOUS

## 2021-04-30 MED ORDER — DROPERIDOL 2.5 MG/ML IJ SOLN: 5.0000 mg | Freq: Once | INTRAMUSCULAR | Status: DC

## 2021-04-30 MED ORDER — DROPERIDOL 2.5 MG/ML IJ SOLN
2.5000 mg | Freq: Once | INTRAMUSCULAR | Status: AC
  Administered 2021-04-30: 2.5 mg via INTRAVENOUS
  Filled 2021-04-30: qty 2

## 2021-04-30 NOTE — Discharge Instructions (Addendum)
-  Follow-up with your gastroenterologist, as discussed. ?-Avoid use of marijuana in order to prevent future episodes. ?-Follow-up with your primary care provider as needed. ?-Return to the emergency department anytime if you begin to experience any new or worsening symptoms ?

## 2021-04-30 NOTE — ED Notes (Signed)
Pt with c/o continued pain in abdomen, EDP notified.  ?

## 2021-04-30 NOTE — ED Notes (Signed)
30 yom with a c/c of abdominal pain, diarrhea, and vomiting since 5 am this morning.  ?

## 2021-04-30 NOTE — ED Provider Notes (Signed)
? ?[facility] ?Provider Note ? ? ? Event Date/Time  ? First MD Initiated Contact with Patient 04/30/21 1058   ?  (approximate) ? ? ?History  ? ?Chief Complaint ?Abdominal Pain ? ? ?HPI ?Ethan Schultz is a 31 y.o. male, history of appendectomy, presents to the emergency department for evaluation of abdominal pain and nausea/vomiting/diarrhea.  Patient states that his symptoms began this morning around 0500.  Reports diffuse 10/10 abdominal pain, primarily in the umbilical/epigastric region.  Endorses 10 episodes of vomiting and diarrhea.  He states that he does have a history of marijuana use, though has not used in the past 2 weeks.  Patient states that he has had appendectomy in the past, but does state that there is a complication with the procedure that caused a " nick" in his colon.  Denies fever/chills, chest pain, shortness of breath, numbness/tingling in upper or lower extremities, or Neri symptoms, blood in stool, blood in vomit, cough, congestion, dizziness, or lightheadedness. ? ?History Limitations: No limitations. ? ?  ? ? ?Physical Exam  ?Triage Vital Signs: ?ED Triage Vitals  ?Enc Vitals Group  ?   BP   ?   Pulse   ?   Resp   ?   Temp   ?   Temp src   ?   SpO2   ?   Weight   ?   Height   ?   Head Circumference   ?   Peak Flow   ?   Pain Score   ?   Pain Loc   ?   Pain Edu?   ?   Excl. in GC?   ? ? ?Most recent vital signs: ?Vitals:  ? 04/30/21 1229  ?BP: (!) 95/56  ?Pulse: (!) 56  ?Resp: 18  ?Temp: 97.8 ?F (36.6 ?C)  ?SpO2: 100%  ? ? ?General: Awake, distressed.  Patient is answering all questions appropriately, though does appear to be acting bizarrely, possibly under influence of drugs. ?Skin: Warm, dry.  ?CV: Good peripheral perfusion.  ?Resp: Normal effort.  ?Abd: Soft, nondistended.  Patient appears to guard very heavily in the mid umbilical/epigastric region ?Neuro: At baseline. No gross neurological deficits.  ?Other: None. ? ?Physical Exam ? ? ? ?ED Results / Procedures /  Treatments  ?Labs ?(all labs ordered are listed, but only abnormal results are displayed) ?Labs Reviewed  ?COMPREHENSIVE METABOLIC PANEL - Abnormal; Notable for the following components:  ?    Result Value  ? Glucose, Bld 134 (*)   ? Total Protein 6.0 (*)   ? AST 44 (*)   ? All other components within normal limits  ?URINALYSIS, ROUTINE W REFLEX MICROSCOPIC - Abnormal; Notable for the following components:  ? Color, Urine YELLOW (*)   ? APPearance CLEAR (*)   ? pH 9.0 (*)   ? Ketones, ur 80 (*)   ? Protein, ur 30 (*)   ? All other components within normal limits  ?URINE DRUG SCREEN, QUALITATIVE ([facility] ONLY) - Abnormal; Notable for the following components:  ? Cannabinoid 50 Ng, Ur [place] POSITIVE (*)   ? All other components within normal limits  ?LIPASE, BLOOD  ?CBC  ? ? ? ?EKG ?Sinus bradycardia, rate 52, no ST segment changes, no axis deviations, no AV blocks, normal QRS interval. ? ? ?RADIOLOGY ? ?ED Provider Interpretation: I personally reviewed and interpreted this image, CT abdomen shows no acute findings. ? ?CT Abdomen Pelvis W Contrast ? ?Result Date: 04/30/2021 ?CLINICAL DATA:  Mid abdominal pain, nausea and vomiting EXAM: CT ABDOMEN AND PELVIS WITH CONTRAST TECHNIQUE: Multidetector CT imaging of the abdomen and pelvis was performed using the standard protocol following bolus administration of intravenous contrast. RADIATION DOSE REDUCTION: This exam was performed according to the departmental dose-optimization program which includes automated exposure control, adjustment of the mA and/or kV according to patient size and/or use of iterative reconstruction technique. CONTRAST:  OMNIPAQUE IOHEXOL 300 MG/ML  SOLN COMPARISON:  10/11/2019 CT abdomen/pelvis. FINDINGS: Lower chest: No significant pulmonary nodules or acute consolidative airspace disease. Hepatobiliary: Normal liver size. No liver mass. Normal gallbladder with no radiopaque cholelithiasis. No biliary ductal dilatation. Pancreas: Normal, with no  mass or duct dilation. Spleen: Normal size. No mass. Adrenals/Urinary Tract: Normal adrenals. Normal kidneys with no hydronephrosis and no renal mass. Normal bladder. Stomach/Bowel: Normal non-distended stomach. Normal caliber small bowel with no small bowel wall thickening. Normal appendix. Normal large bowel with no diverticulosis, large bowel wall thickening or pericolonic fat stranding. Vascular/Lymphatic: Normal caliber abdominal aorta. Patent portal, splenic and renal veins. No pathologically enlarged lymph nodes in the abdomen or pelvis. Reproductive: Normal size prostate. Other: No pneumoperitoneum, ascites or focal fluid collection. Musculoskeletal: No aggressive appearing focal osseous lesions. IMPRESSION: No acute abnormality. No evidence of bowel obstruction or acute bowel inflammation. Normal appendix. Electronically Signed   By: Delbert Phenix M.D.   On: 04/30/2021 12:20   ? ?PROCEDURES: ? ?Critical Care performed: None ? ?Procedures ? ? ? ?MEDICATIONS ORDERED IN ED: ?Medications  ?ondansetron (ZOFRAN) injection 4 mg (4 mg Intravenous Given 04/30/21 1113)  ?sodium chloride 0.9 % bolus 1,000 mL (0 mLs Intravenous Stopped 04/30/21 1416)  ?fentaNYL (SUBLIMAZE) injection 50 mcg (50 mcg Intravenous Given 04/30/21 1117)  ?iohexol (OMNIPAQUE) 300 MG/ML solution 100 mL (100 mLs Intravenous Contrast Given 04/30/21 1155)  ?capsaicin (ZOSTRIX) 0.025 % cream ( Topical Given 04/30/21 1254)  ?droperidol (INAPSINE) 2.5 MG/ML injection 2.5 mg (2.5 mg Intravenous Given 04/30/21 1243)  ?lactated ringers bolus 1,000 mL (1,000 mLs Intravenous New Bag/Given 04/30/21 1417)  ? ? ? ?IMPRESSION / MDM / ASSESSMENT AND PLAN / ED COURSE  ?I reviewed the triage vital signs and the nursing notes. ?             ?               ? ?Differential diagnosis includes, but is not limited to, cyclic hyperemesis syndrome, cholangitis, cholecystitis, nephrolithiasis, pancreatitis, opiate/alcohol withdrawal ? ?ED Course ?Patient appears stable, but  endorsing significant pain.  Hypertensive at 95/56.  We will go ahead and initiate IV fluids and pain meds. ? ?CBC shows no evidence of leukocytosis or anemia.  Lipase negative at 25.  Unlikely pancreatitis. ? ?CMP shows no remarkable electrolyte abnormalities or transaminitis.  No significant evidence of kidney injury. ? ?Urine drug screen positive for cannabinoid. ? ?Urinalysis significant for proteinuria and ketonuria, likely secondary to dehydration.  Otherwise no evidence of infection ? ?Upon reexamination, patient appears to have calmed down.  He is no longer vomiting at this time. ? ?Assessment/Plan ?Patient presents with significant abdominal pain, vomiting, and diarrhea.  Given his endorsement of regular marijuana use, likely cannabinoid hyperemesis.  Patient does not endorse any opiate or significant alcohol use, withdrawal unlikely.  Treated the patient here with IV fluids, antiemetics, droperidol, capsaicin cream with moderate improvement.  CT scan reassuring for no acute abdominal/pelvic process.  Advised patient that he will need to see a gastroenterologist, as he has had multiple episodes like this  in the past with unremarkable work-ups.  Patient states that he has a regular gastroenterologist that he sees.  Offered the patient a prescription for antiemetics, however patient states that he has some at home and does not want to be prescribed anything. ? ?Considered admission for this patient, but given the improvement in his symptoms and unremarkable work-up, he is unlikely to benefit. ? ?*Of note, as patient was beginning his second infusion of IV fluids, he stated that his ride was here and stated he did not want the IV fluids.  Advised him that I was concerned about his low blood pressure, however he stated that he needed to go home and does not wish to have any further treatment or work-up.  Will discharge.  Encouraged him to return the emergency department at any time if you begin to experience  any new or worsening symptoms. ? ?Patient was provided with anticipatory guidance, return precautions, and educational material.  ?  ? ? ?FINAL CLINICAL IMPRESSION(S) / ED DIAGNOSES  ? ?Final diagnoses:  ?Gen

## 2021-04-30 NOTE — ED Triage Notes (Signed)
Pt comes into the ED via EMS from home with c/o abd pain with N/V/D since 5am today. ?CBG125 ?VSS ?

## 2021-10-03 IMAGING — US US ABDOMEN LIMITED
1 series · 15 of 25 positions shown · non-contrast
Comparison: CT of the abdomen pelvis dated 10/11/2019.

CLINICAL DATA: 29-year-old male with right upper quadrant abdominal
pain.

EXAM:
ULTRASOUND ABDOMEN LIMITED RIGHT UPPER QUADRANT

[Series 1: us abdomen limited · 15 of 46 slices shown]
[im 1/46]
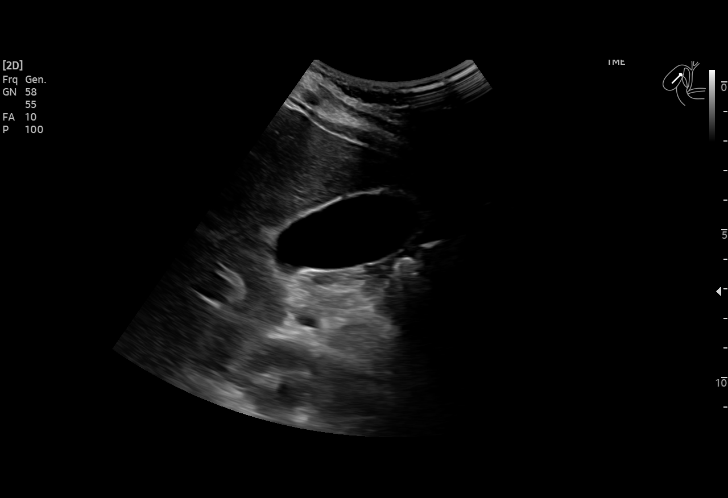
[im 4/46]
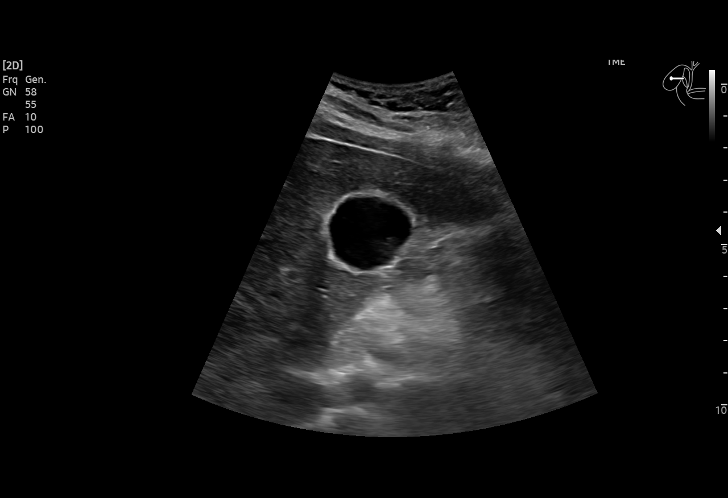
[im 8/46]
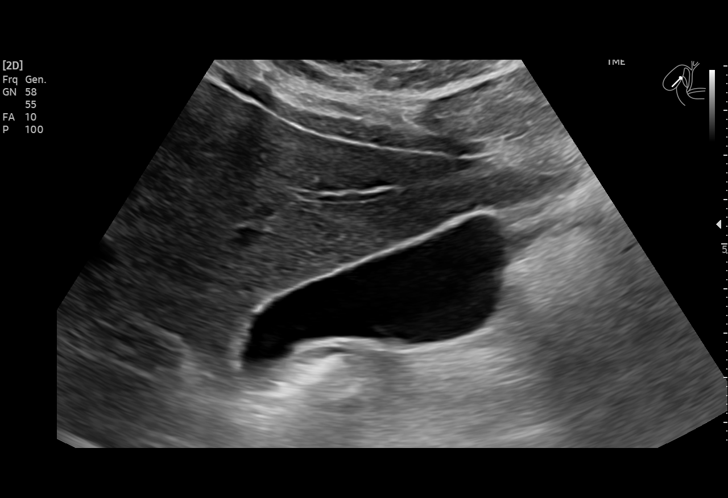
[im 10/46]
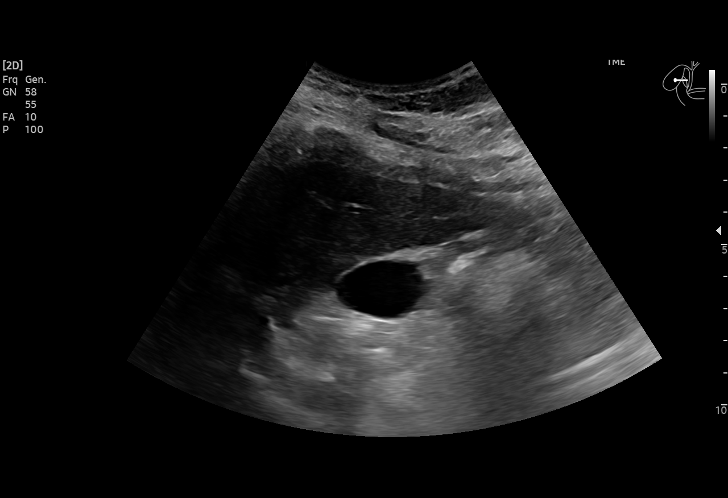
[im 14/46]
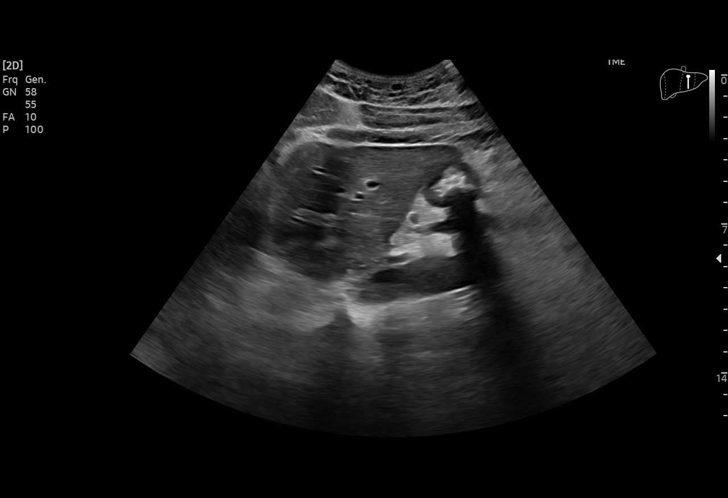
[im 17/46]
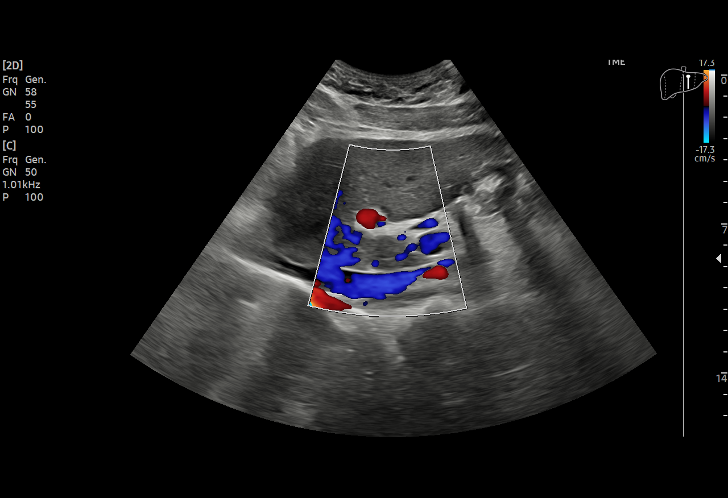
[im 19/46]
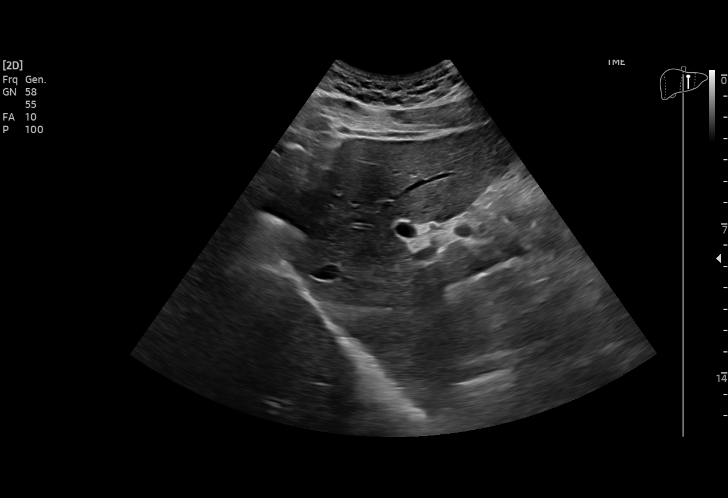
[im 23/46]
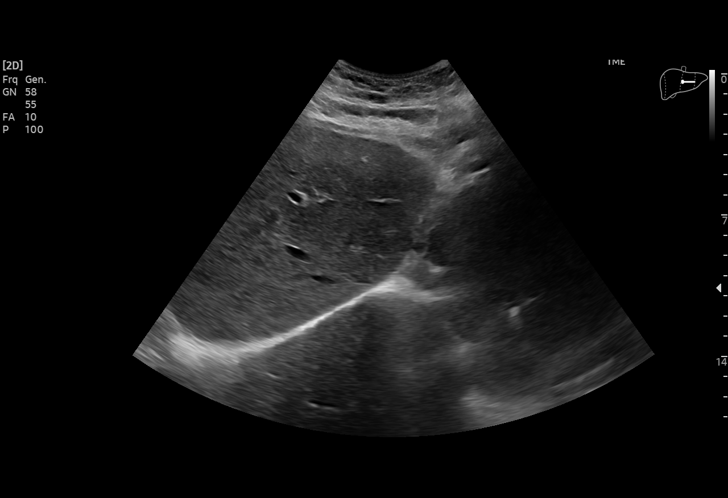
[im 27/46]
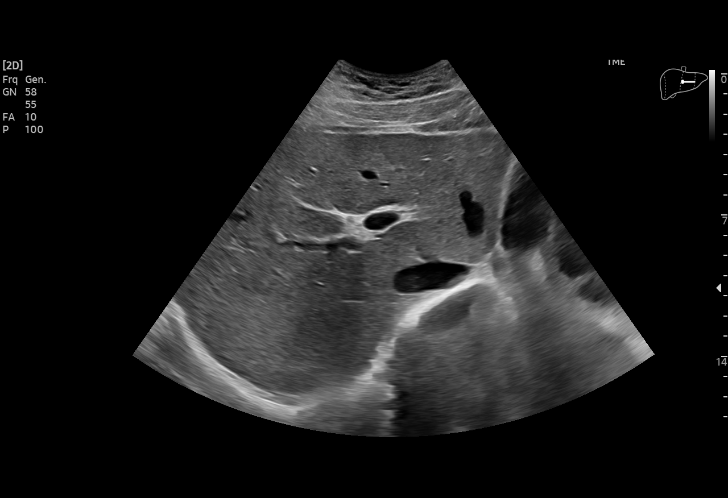
[im 29/46]
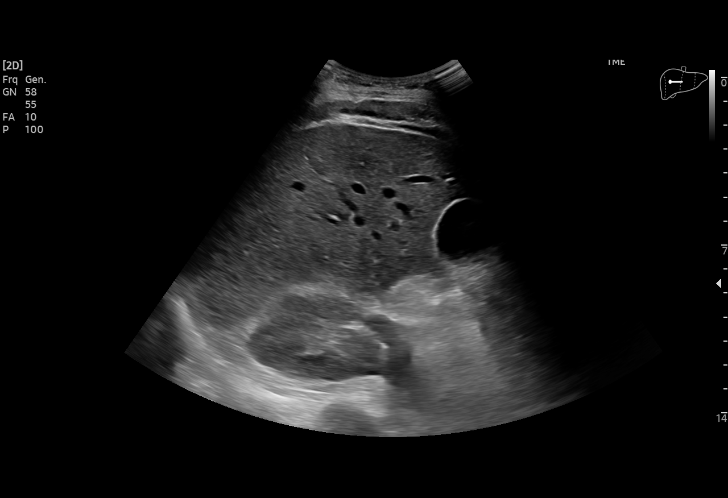
[im 32/46]
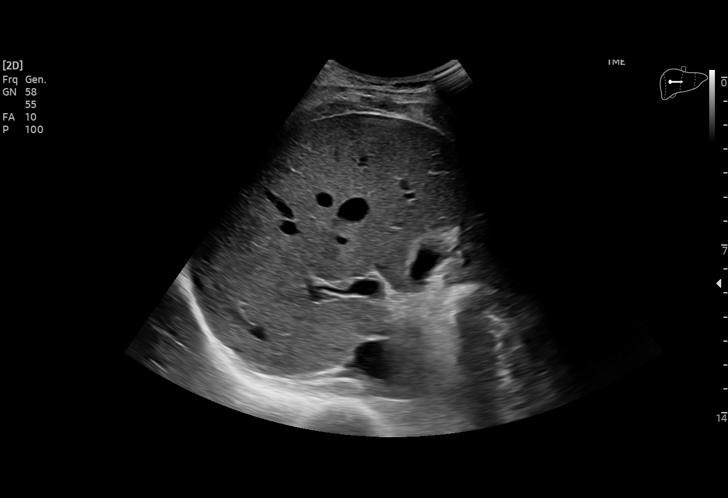
[im 36/46]
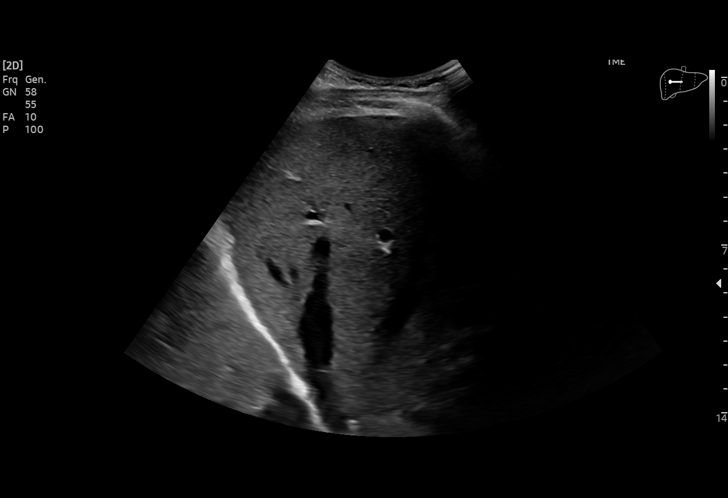
[im 38/46]
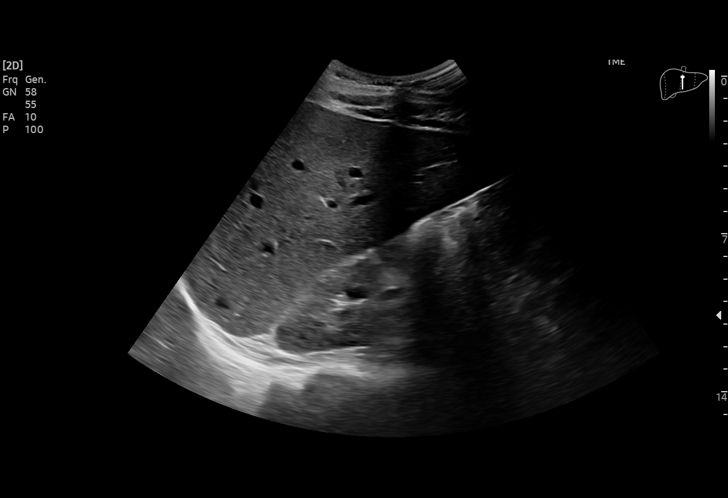
[im 42/46]
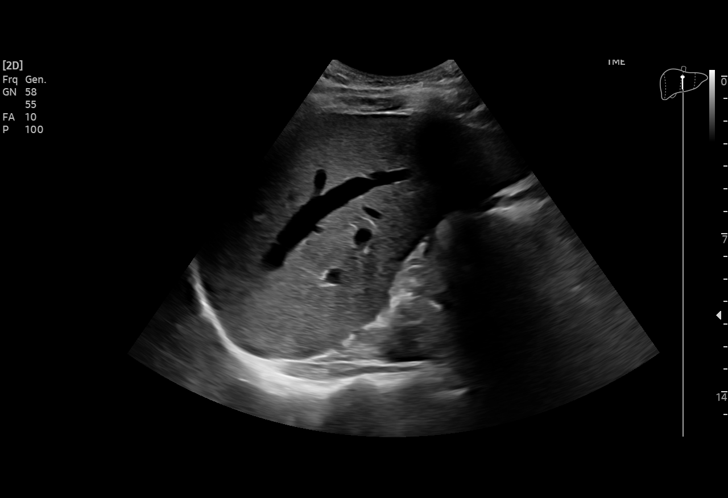
[im 46/46]
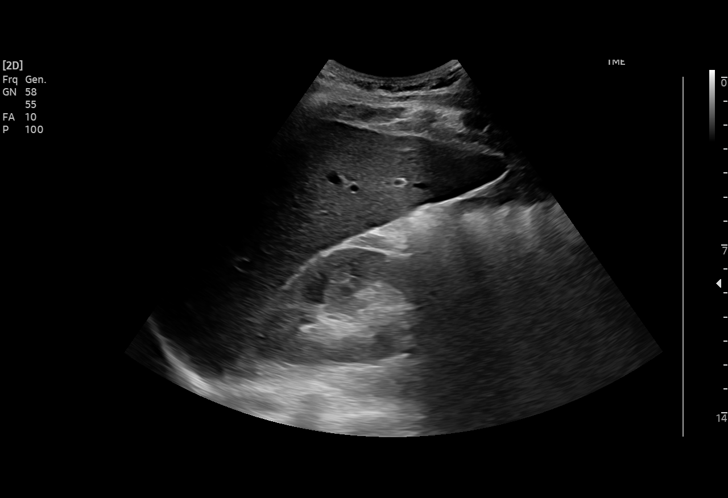

[15 of 25 positions shown; findings below may reference images not displayed]

FINDINGS: Gallbladder:

No gallstones or wall thickening visualized. No sonographic Murphy
sign noted by sonographer.

Common bile duct:

Diameter: 3 mm

Liver:

No focal lesion identified. Within normal limits in parenchymal
echogenicity. Portal vein is patent on color Doppler imaging with
normal direction of blood flow towards the liver.

Other: None.
IMPRESSION: Unremarkable right upper quadrant ultrasound.

## 2021-12-08 ENCOUNTER — Other Ambulatory Visit: Payer: Self-pay

## 2021-12-08 ENCOUNTER — Emergency Department
Admission: EM | Admit: 2021-12-08 | Discharge: 2021-12-08 | Disposition: A | Discharge status: Home / Self Care | Payer: Self-pay | Attending: Emergency Medicine | Admitting: Emergency Medicine

## 2021-12-08 DIAGNOSIS — R7309 Other abnormal glucose: Secondary | ICD-10-CM | POA: Insufficient documentation

## 2021-12-08 DIAGNOSIS — G8929 Other chronic pain: Secondary | ICD-10-CM | POA: Insufficient documentation

## 2021-12-08 DIAGNOSIS — R1030 Lower abdominal pain, unspecified: Secondary | ICD-10-CM | POA: Insufficient documentation

## 2021-12-08 LAB — COMPREHENSIVE METABOLIC PANEL
ALT: 12 U/L (ref 0–44)
AST: 26 U/L (ref 15–41)
Albumin: 4.1 g/dL (ref 3.5–5.0)
Alkaline Phosphatase: 39 U/L (ref 38–126)
Anion gap: 12 (ref 5–15)
BUN: 18 mg/dL (ref 6–20)
CO2: 17 mmol/L — ABNORMAL LOW (ref 22–32)
Calcium: 9.2 mg/dL (ref 8.9–10.3)
Chloride: 109 mmol/L (ref 98–111)
Creatinine, Ser: 1.06 mg/dL (ref 0.61–1.24)
GFR, Estimated: >60 mL/min (ref >60)
Glucose, Bld: 173 mg/dL — ABNORMAL HIGH (ref 70–99)
Potassium: 4.3 mmol/L (ref 3.5–5.1)
Sodium: 138 mmol/L (ref 135–145)
Total Bilirubin: 1.3 mg/dL — ABNORMAL HIGH (ref 0.3–1.2)
Total Protein: 7 g/dL (ref 6.5–8.1)

## 2021-12-08 LAB — URINALYSIS, ROUTINE W REFLEX MICROSCOPIC
Bilirubin Urine: NEGATIVE
Glucose, UA: 150 mg/dL — AB
Hgb urine dipstick: NEGATIVE
Ketones, ur: 20 mg/dL — AB
Leukocytes,Ua: NEGATIVE
Nitrite: NEGATIVE
Protein, ur: NEGATIVE mg/dL
Specific Gravity, Urine: 1.013 (ref 1.005–1.030)
pH: 8 (ref 5.0–8.0)

## 2021-12-08 LAB — CBC
HCT: 40.9 % (ref 39.0–52.0)
Hemoglobin: 13.7 g/dL (ref 13.0–17.0)
MCH: 31.4 pg (ref 26.0–34.0)
MCHC: 33.5 g/dL (ref 30.0–36.0)
MCV: 93.8 fL (ref 80.0–100.0)
Platelets: 211 10*3/uL (ref 150–400)
RBC: 4.36 MIL/uL (ref 4.22–5.81)
RDW: 11.5 % (ref 11.5–15.5)
WBC: 10.4 10*3/uL (ref 4.0–10.5)
nRBC: 0 % (ref 0.0–0.2)

## 2021-12-08 LAB — TROPONIN I (HIGH SENSITIVITY): Troponin I (High Sensitivity): <2 ng/L (ref <18)

## 2021-12-08 LAB — LIPASE, BLOOD: Lipase: 24 U/L (ref 11–51)

## 2021-12-08 MED ORDER — HALOPERIDOL LACTATE 5 MG/ML IJ SOLN
5.0000 mg | Freq: Once | INTRAMUSCULAR | Status: AC
  Administered 2021-12-08: 5 mg via INTRAVENOUS
  Filled 2021-12-08: qty 1

## 2021-12-08 MED ORDER — SODIUM CHLORIDE 0.9 % IV BOLUS
1000.0000 mL | Freq: Once | INTRAVENOUS | Status: AC
Start: 2021-12-08 — End: 2021-12-08
  Administered 2021-12-08: 1000 mL via INTRAVENOUS

## 2021-12-08 MED ORDER — METOCLOPRAMIDE HCL 10 MG PO TABS: 10.0000 mg | ORAL_TABLET | Freq: Three times a day (TID) | ORAL | 0 refills | Status: AC | PRN

## 2021-12-08 NOTE — ED Notes (Signed)
Pt given ice water and graham crackers for PO challenge

## 2021-12-08 NOTE — ED Notes (Signed)
Pt tolerating PO crackers and water.

## 2021-12-08 NOTE — ED Provider Notes (Signed)
[facility] Provider Note    Event Date/Time   First MD Initiated Contact with Patient 12/08/21 1004     (approximate)   History   Abdominal Pain   HPI  Ethan Schultz is a 31 y.o. male with chronic abdominal pain who comes in with concerns for abdominal pain.  Patient reports lower abdominal pain that he has intermittently over the past 10 years.  He reports that he thinks it started after his appendix was removed removed and he reports being neck.  He was given 125 of fentanyl, 4 of Versed and 4 of Zofran prior to arrival.  Patient reports being seen on 9/11 for similar had a negative CT scan at that time.  Patient reports this is the same pain that has had multiple times for it  Physical Exam   Triage Vital Signs: ED Triage Vitals  Enc Vitals Group     BP 12/08/21 0934 (!) 139/94     Pulse Rate 12/08/21 0931 77     Resp 12/08/21 0937 20     Temp 12/08/21 0937 97.6 F (36.4 C)     Temp src --      SpO2 12/08/21 0931 99 %     Weight 12/08/21 0933 175 lb (79.4 kg)     Height 12/08/21 0933 5\' 10"  (1.778 m)     Head Circumference --      Peak Flow --      Pain Score 12/08/21 0933 10     Pain Loc --      Pain Edu? --      Excl. in GC? --     Most recent vital signs: Vitals:   12/08/21 0934 12/08/21 0937  BP: (!) 139/94   Pulse:    Resp:  20  Temp:  97.6 F (36.4 C)  SpO2:       General: Awake, no distress.  CV:  Good peripheral perfusion.  Resp:  Normal effort.  Abd:  No distention.  Patient reports pain but is no rebound or guarding. Other:     ED Results / Procedures / Treatments   Labs (all labs ordered are listed, but only abnormal results are displayed) Labs Reviewed  CBC  LIPASE, BLOOD  COMPREHENSIVE METABOLIC PANEL  URINALYSIS, ROUTINE W REFLEX MICROSCOPIC     EKG  My interpretation of EKG:  Normal sinus rate of 53 without any significant ST elevation or T wave inversions, normal  intervals  RADIOLOGY   PROCEDURES:  Critical Care performed: No  Procedures   MEDICATIONS ORDERED IN ED: Medications  sodium chloride 0.9 % bolus 1,000 mL (has no administration in time range)  haloperidol lactate (HALDOL) injection 5 mg (5 mg Intravenous Given 12/08/21 1001)     IMPRESSION / MDM / ASSESSMENT AND PLAN / ED COURSE  I reviewed the triage vital signs and the nursing notes.   Patient's presentation is most consistent with acute presentation with potential threat to life or bodily function.   Suspect this is most likely related to his chronic pain.  Labs ordered evaluate for any evidence of choledocholithiasis, pancreatitis.  Suspect related to chronic abdominal pain versus THC use.  Patient given 5 of IV Haldol and labs are ordered.  UA with some mild ketones in it but patient is already gotten some fluids.  Lipase normal.  CMP shows slightly low bicarb consistent with patient's vomiting but his anion gap is normal.  T. bili slightly elevated but similar to prior and most  likely from dehydration which he is already getting some fluids.  His CBC is reassuring troponin was negative.  On reassessment patient reports feeling much better.  We discussed CT imaging and patient reports having this recently and this being his same pain that he normally has so he declined CT imaging today.  We will do p.o. challenge and then suspect discharge home    FINAL CLINICAL IMPRESSION(S) / ED DIAGNOSES   Final diagnoses:  Chronic abdominal pain     Rx / DC Orders   ED Discharge Orders          Ordered    metoCLOPramide (REGLAN) 10 MG tablet  Every 8 hours PRN        12/08/21 1250             Note:  This document was prepared using Dragon voice recognition software and may include unintentional dictation errors.   Vanessa Asbury, MD 12/08/21 1252

## 2021-12-08 NOTE — Discharge Instructions (Addendum)
We discussed CT imaging but given you have had this previously you have opted to decline CT imaging today and your pain was better with medications and your blood work all looked reassuring and similar to prior.  Try to avoid any alcohol marijuana and you can take the nausea medicine as needed to help with pain and you could call the GI number to make a follow-up appointment  Return to the ER for worsening pain fevers or any other concerns

## 2021-12-08 NOTE — ED Notes (Signed)
Pt resting comfortably at this time, family at bedside. Call bell in reach. NAD noted

## 2021-12-08 NOTE — ED Triage Notes (Addendum)
Pt to ED [facility] from home for mid right abd pain started this morning.Marland Kitchen Appendix removed 10 years ago, and intestines "nicked", has had multiple hospitalizations since 153mcg fentanyl 4mg  versed 4mg  zofran given PTA  Pt seen for same on 9/11, advised to stop smoking marijuana. Ct abdomen neg   Pt moaning loudly, states "I dont want this shit on me" when monitors placed on pt, moving around and thrashing in stretcher

## 2021-12-08 NOTE — ED Notes (Signed)
Dr Jari Pigg to bedside to assess pt

## 2021-12-08 NOTE — ED Notes (Signed)
Signature pad not working, pt verbalizes understanding of d/c instructions. Denies questions or concerns. NAD noetd

## 2022-08-20 ENCOUNTER — Emergency Department
Admission: EM | Admit: 2022-08-20 | Discharge: 2022-08-20 | Disposition: A | Discharge status: Home / Self Care | Payer: Self-pay | Attending: Emergency Medicine | Admitting: Emergency Medicine

## 2022-08-20 ENCOUNTER — Encounter: Payer: Self-pay | Admitting: Emergency Medicine

## 2022-08-20 ENCOUNTER — Other Ambulatory Visit: Payer: Self-pay

## 2022-08-20 DIAGNOSIS — R1115 Cyclical vomiting syndrome unrelated to migraine: Secondary | ICD-10-CM | POA: Insufficient documentation

## 2022-08-20 LAB — COMPREHENSIVE METABOLIC PANEL
ALT: 13 U/L (ref 0–44)
AST: 22 U/L (ref 15–41)
Albumin: 4.5 g/dL (ref 3.5–5.0)
Alkaline Phosphatase: 49 U/L (ref 38–126)
Anion gap: 14 (ref 5–15)
BUN: 21 mg/dL — ABNORMAL HIGH (ref 6–20)
CO2: 17 mmol/L — ABNORMAL LOW (ref 22–32)
Calcium: 9 mg/dL (ref 8.9–10.3)
Chloride: 105 mmol/L (ref 98–111)
Creatinine, Ser: 1.12 mg/dL (ref 0.61–1.24)
GFR, Estimated: >60 mL/min (ref >60)
Glucose, Bld: 164 mg/dL — ABNORMAL HIGH (ref 70–99)
Potassium: 3.9 mmol/L (ref 3.5–5.1)
Sodium: 136 mmol/L (ref 135–145)
Total Bilirubin: 1 mg/dL (ref 0.3–1.2)
Total Protein: 7.5 g/dL (ref 6.5–8.1)

## 2022-08-20 LAB — CBC
HCT: 42.3 % (ref 39.0–52.0)
Hemoglobin: 14.5 g/dL (ref 13.0–17.0)
MCH: 31.3 pg (ref 26.0–34.0)
MCHC: 34.3 g/dL (ref 30.0–36.0)
MCV: 91.4 fL (ref 80.0–100.0)
Platelets: 251 10*3/uL (ref 150–400)
RBC: 4.63 MIL/uL (ref 4.22–5.81)
RDW: 11.3 % — ABNORMAL LOW (ref 11.5–15.5)
WBC: 11.3 10*3/uL — ABNORMAL HIGH (ref 4.0–10.5)
nRBC: 0 % (ref 0.0–0.2)

## 2022-08-20 LAB — LIPASE, BLOOD: Lipase: 30 U/L (ref 11–51)

## 2022-08-20 LAB — URINALYSIS, ROUTINE W REFLEX MICROSCOPIC
Bilirubin Urine: NEGATIVE
Glucose, UA: NEGATIVE mg/dL
Hgb urine dipstick: NEGATIVE
Ketones, ur: 20 mg/dL — AB
Leukocytes,Ua: NEGATIVE
Nitrite: NEGATIVE
Protein, ur: NEGATIVE mg/dL
Specific Gravity, Urine: 1.017 (ref 1.005–1.030)
pH: 8 (ref 5.0–8.0)

## 2022-08-20 MED ORDER — SODIUM CHLORIDE 0.9 % IV SOLN: Freq: Once | INTRAVENOUS | Status: AC

## 2022-08-20 MED ORDER — DROPERIDOL 2.5 MG/ML IJ SOLN
1.2500 mg | Freq: Once | INTRAMUSCULAR | Status: AC
  Administered 2022-08-20: 1.25 mg via INTRAVENOUS
  Filled 2022-08-20: qty 2

## 2022-08-20 MED ORDER — ONDANSETRON HCL 4 MG/2ML IJ SOLN
4.0000 mg | Freq: Once | INTRAMUSCULAR | Status: AC
  Administered 2022-08-20: 4 mg via INTRAVENOUS
  Filled 2022-08-20: qty 2

## 2022-08-20 MED ORDER — SODIUM CHLORIDE 0.9 % IV SOLN
25.0000 mg | Freq: Four times a day (QID) | INTRAVENOUS | Status: DC | PRN
  Administered 2022-08-20: 25 mg via INTRAVENOUS
  Filled 2022-08-20: qty 1

## 2022-08-20 NOTE — ED Notes (Addendum)
Pt intermittently writhing on stretcher and groaning and c/o generalized abd pain; pt had vomited multiple times; yellow fluid in emesis bag; pt also states episodes of diarrhea since this morning; denies any changes to eating/drinking habits; denies using any substances; denies any medical conditions; states neither zofran nor phenergan have worked for him in past per pt. Urinal attached to bedrail for pt; pt prompted to provide urine sample when able.

## 2022-08-20 NOTE — ED Triage Notes (Signed)
Pt comes via EMS from home with c/o belly pain. Pt has hx of gastroenteritis. Pt states sudden onset of this this am. Pt states N/V. Pt given 100 mcg fent and 4 of zofran.  Iv in place.  300 NS

## 2022-08-20 NOTE — ED Provider Notes (Signed)
[facility] Provider Note    Event Date/Time   First MD Initiated Contact with Patient 08/20/22 1252     (approximate)   History   Abdominal Pain   HPI  Jean-Paul Kosh is a 32 y.o. male with a history of cyclical vomiting syndrome who presents with complaints of nausea vomiting abdominal cramping.  He reports this is consistent with prior episodes as well.       Physical Exam   Triage Vital Signs: ED Triage Vitals  Enc Vitals Group     BP 08/20/22 1214 127/85     Pulse Rate 08/20/22 1214 65     Resp 08/20/22 1214 18     Temp 08/20/22 1214 97.8 F (36.6 C)     Temp src --      SpO2 08/20/22 1214 99 %     Weight 08/20/22 1308 79.4 kg (175 lb 0.7 oz)     Height 08/20/22 1308 1.778 m (5\' 10" )     Head Circumference --      Peak Flow --      Pain Score 08/20/22 1146 10     Pain Loc --      Pain Edu? --      Excl. in GC? --     Most recent vital signs: Vitals:   08/20/22 1514 08/20/22 1531  BP:  (!) 98/56  Pulse: (!) 56 77  Resp: 16 18  Temp:    SpO2: 100% 100%     General: Awake, no distress.  CV:  Good peripheral perfusion.  Resp:  Normal effort.  Abd:  No distention.  Soft, nontender Other:     ED Results / Procedures / Treatments   Labs (all labs ordered are listed, but only abnormal results are displayed) Labs Reviewed  COMPREHENSIVE METABOLIC PANEL - Abnormal; Notable for the following components:      Result Value   CO2 17 (*)    Glucose, Bld 164 (*)    BUN 21 (*)    All other components within normal limits  CBC - Abnormal; Notable for the following components:   WBC 11.3 (*)    RDW 11.3 (*)    All other components within normal limits  URINALYSIS, ROUTINE W REFLEX MICROSCOPIC - Abnormal; Notable for the following components:   Color, Urine YELLOW (*)    APPearance CLEAR (*)    Ketones, ur 20 (*)    All other components within normal limits  LIPASE, BLOOD      EKG     RADIOLOGY     PROCEDURES:  Critical Care performed:   Procedures   MEDICATIONS ORDERED IN ED: Medications  promethazine (PHENERGAN) 25 mg in sodium chloride 0.9 % 50 mL IVPB (0 mg Intravenous Stopped 08/20/22 1358)  ondansetron (ZOFRAN) injection 4 mg (4 mg Intravenous Given 08/20/22 1213)  0.9 %  sodium chloride infusion (0 mLs Intravenous Stopped 08/20/22 1430)  droperidol (INAPSINE) 2.5 MG/ML injection 1.25 mg (1.25 mg Intravenous Given 08/20/22 1415)     IMPRESSION / MDM / ASSESSMENT AND PLAN / ED COURSE  I reviewed the triage vital signs and the nursing notes. Patient's presentation is most consistent with severe exacerbation of chronic illness.  Patient with a history of cyclical vomiting syndrome presents with nausea vomiting abdominal cramping.  Initially treated with ODT Zofran with little improvement, IV Phenergan afterwards.  Still with continued vomiting and cramping.  Given IV droperidol with significant improvement in symptoms.  Lab work is reassuring  I have referred the patient to GI, no indication for admission at this time        FINAL CLINICAL IMPRESSION(S) / ED DIAGNOSES   Final diagnoses:  Cyclical vomiting     Rx / DC Orders   ED Discharge Orders     None        Note:  This document was prepared using Dragon voice recognition software and may include unintentional dictation errors.   Jene Every, MD 08/20/22 1550

## 2022-08-20 NOTE — ED Notes (Signed)
Pt alert and pt no longer calling out in pain or shifting around on bed.

## 2022-08-20 NOTE — ED Notes (Signed)
Pt continues to intermittently vomit. Pt given fresh warm blankets as requested.

## 2022-08-20 NOTE — ED Notes (Signed)
Pt placed on monitor. Phenergan not yet received from pharm. Linda RN messaged pharm to send to [facility] stat. Pt's skin dry and resp reg/unlabored.

## 2022-09-23 ENCOUNTER — Emergency Department
Admission: EM | Admit: 2022-09-23 | Discharge: 2022-09-23 | Disposition: A | Discharge status: Home / Self Care | Payer: Self-pay | Attending: Emergency Medicine | Admitting: Emergency Medicine

## 2022-09-23 DIAGNOSIS — R1084 Generalized abdominal pain: Secondary | ICD-10-CM | POA: Insufficient documentation

## 2022-09-23 DIAGNOSIS — R112 Nausea with vomiting, unspecified: Secondary | ICD-10-CM | POA: Insufficient documentation

## 2022-09-23 DIAGNOSIS — D72829 Elevated white blood cell count, unspecified: Secondary | ICD-10-CM | POA: Insufficient documentation

## 2022-09-23 LAB — CBC WITH DIFFERENTIAL/PLATELET
Abs Immature Granulocytes: 0.04 10*3/uL (ref 0.00–0.07)
Basophils Absolute: 0 10*3/uL (ref 0.0–0.1)
Basophils Relative: 0 %
Eosinophils Absolute: 0 10*3/uL (ref 0.0–0.5)
Eosinophils Relative: 0 %
HCT: 41 % (ref 39.0–52.0)
Hemoglobin: 14.2 g/dL (ref 13.0–17.0)
Immature Granulocytes: 0 %
Lymphocytes Relative: 8 %
Lymphs Abs: 0.9 10*3/uL (ref 0.7–4.0)
MCH: 31.1 pg (ref 26.0–34.0)
MCHC: 34.6 g/dL (ref 30.0–36.0)
MCV: 89.7 fL (ref 80.0–100.0)
Monocytes Absolute: 0.5 10*3/uL (ref 0.1–1.0)
Monocytes Relative: 4 %
Neutro Abs: 9.2 10*3/uL — ABNORMAL HIGH (ref 1.7–7.7)
Neutrophils Relative %: 88 %
Platelets: 286 10*3/uL (ref 150–400)
RBC: 4.57 MIL/uL (ref 4.22–5.81)
RDW: 11.6 % (ref 11.5–15.5)
WBC: 10.6 10*3/uL — ABNORMAL HIGH (ref 4.0–10.5)
nRBC: 0 % (ref 0.0–0.2)

## 2022-09-23 LAB — LIPASE, BLOOD: Lipase: 30 U/L (ref 11–51)

## 2022-09-23 LAB — COMPREHENSIVE METABOLIC PANEL
ALT: 14 U/L (ref 0–44)
AST: 19 U/L (ref 15–41)
Albumin: 4.4 g/dL (ref 3.5–5.0)
Alkaline Phosphatase: 50 U/L (ref 38–126)
Anion gap: 15 (ref 5–15)
BUN: 17 mg/dL (ref 6–20)
CO2: 18 mmol/L — ABNORMAL LOW (ref 22–32)
Calcium: 9.5 mg/dL (ref 8.9–10.3)
Chloride: 104 mmol/L (ref 98–111)
Creatinine, Ser: 1.19 mg/dL (ref 0.61–1.24)
GFR, Estimated: >60 mL/min (ref >60)
Glucose, Bld: 110 mg/dL — ABNORMAL HIGH (ref 70–99)
Potassium: 4 mmol/L (ref 3.5–5.1)
Sodium: 137 mmol/L (ref 135–145)
Total Bilirubin: 1.3 mg/dL — ABNORMAL HIGH (ref 0.3–1.2)
Total Protein: 8 g/dL (ref 6.5–8.1)

## 2022-09-23 MED ORDER — DROPERIDOL 2.5 MG/ML IJ SOLN
2.5000 mg | Freq: Once | INTRAMUSCULAR | Status: AC
  Administered 2022-09-23: 2.5 mg via INTRAVENOUS
  Filled 2022-09-23: qty 2

## 2022-09-23 MED ORDER — PROMETHAZINE HCL 25 MG PO TABS: 25.0000 mg | ORAL_TABLET | Freq: Four times a day (QID) | ORAL | 0 refills | Status: AC | PRN

## 2022-09-23 MED ORDER — PROMETHAZINE HCL 25 MG RE SUPP: 25.0000 mg | Freq: Four times a day (QID) | RECTAL | 0 refills | Status: AC | PRN

## 2022-09-23 MED ORDER — FENTANYL CITRATE PF 50 MCG/ML IJ SOSY
50.0000 ug | PREFILLED_SYRINGE | Freq: Once | INTRAMUSCULAR | Status: AC
  Administered 2022-09-23: 50 ug via INTRAVENOUS
  Filled 2022-09-23: qty 1

## 2022-09-23 MED ORDER — SODIUM CHLORIDE 0.9 % IV BOLUS
1000.0000 mL | Freq: Once | INTRAVENOUS | Status: AC
  Administered 2022-09-23: 1000 mL via INTRAVENOUS

## 2022-09-23 MED ORDER — ONDANSETRON HCL 4 MG/2ML IJ SOLN
4.0000 mg | Freq: Once | INTRAMUSCULAR | Status: AC
  Administered 2022-09-23: 4 mg via INTRAVENOUS
  Filled 2022-09-23: qty 2

## 2022-09-23 MED ORDER — DICYCLOMINE HCL 10 MG PO CAPS
10.0000 mg | ORAL_CAPSULE | Freq: Once | ORAL | Status: AC
  Administered 2022-09-23: 10 mg via ORAL
  Filled 2022-09-23: qty 1

## 2022-09-23 MED ORDER — KETOROLAC TROMETHAMINE 15 MG/ML IJ SOLN
15.0000 mg | Freq: Once | INTRAMUSCULAR | Status: AC
  Administered 2022-09-23: 15 mg via INTRAVENOUS
  Filled 2022-09-23: qty 1

## 2022-09-23 NOTE — ED Provider Notes (Signed)
[facility] Provider Note    Event Date/Time   First MD Initiated Contact with Patient 09/23/22 [phone]     (approximate)   History   Abdominal Pain (Chronic abdominal pain patient (most recent ED visit was here yesterday) is here today for abdominal pain; Patient has never followed up with GI and does not take any daily medications)   HPI  Ethan Schultz is a 32 y.o. male with history of reported cannabinoid hyperemesis presenting to the emergency department today for recurrent symptoms.  Was seen in the [facility] ER yesterday.  Reports that he gets meds in the ER, feels better, but has worsening symptoms when he goes home.  This is similar to his prior episodes.  No fevers.  Reports generalized abdominal pain.      Physical Exam   Triage Vital Signs: ED Triage Vitals  Encounter Vitals Group     BP 09/23/22 0945 107/67     Systolic BP Percentile --      Diastolic BP Percentile --      Pulse Rate 09/23/22 0945 63     Resp --      Temp 09/23/22 0951 98.4 F (36.9 C)     Temp Source 09/23/22 0951 Oral     SpO2 09/23/22 0945 100 %     Weight --      Height --      Head Circumference --      Peak Flow --      Pain Score 09/23/22 1032 10     Pain Loc --      Pain Education --      Exclude from Growth Chart --     Most recent vital signs: Vitals:   09/23/22 1230 09/23/22 1300  BP: 111/63 120/65  Pulse: 61 61  Temp:    SpO2: 99% 100%     General: Awake, interactive  CV:  Regular rate, good peripheral perfusion.  Resp:  Lungs clear, unlabored respirations.  Abd:  Soft, nondistended, generalized tenderness without focality Neuro:  Symmetric facial movement, fluid speech   ED Results / Procedures / Treatments   Labs (all labs ordered are listed, but only abnormal results are displayed) Labs Reviewed  COMPREHENSIVE METABOLIC PANEL - Abnormal; Notable for the following components:      Result Value   CO2 18 (*)    Glucose, Bld 110 (*)     Total Bilirubin 1.3 (*)    All other components within normal limits  CBC WITH DIFFERENTIAL/PLATELET - Abnormal; Notable for the following components:   WBC 10.6 (*)    Neutro Abs 9.2 (*)    All other components within normal limits  LIPASE, BLOOD     EKG EKG independently reviewed interpreted by myself (ER attending) demonstrates:  EKG demonstrate sinus rhythm at a rate of 53, PR 151, QRS 98, QTc 423, ST changes noted in multiple leads, seems most consistent with benign early repolarization, do not feel that this is reflective of acute ischemia, similar to prior EKG from 12/08/2021  RADIOLOGY Imaging independently reviewed and interpreted by myself demonstrates:    PROCEDURES:  Critical Care performed: No  Procedures   MEDICATIONS ORDERED IN ED: Medications  sodium chloride 0.9 % bolus 1,000 mL (0 mLs Intravenous Stopped 09/23/22 1321)  droperidol (INAPSINE) 2.5 MG/ML injection 2.5 mg (2.5 mg Intravenous Given 09/23/22 1029)  ketorolac (TORADOL) 15 MG/ML injection 15 mg (15 mg Intravenous Given 09/23/22 1135)  ondansetron (ZOFRAN) injection 4 mg (4 mg  Intravenous Given 09/23/22 1135)  fentaNYL (SUBLIMAZE) injection 50 mcg (50 mcg Intravenous Given 09/23/22 1318)  dicyclomine (BENTYL) capsule 10 mg (10 mg Oral Given 09/23/22 1319)     IMPRESSION / MDM / ASSESSMENT AND PLAN / ED COURSE  I reviewed the triage vital signs and the nursing notes.  Differential diagnosis includes, but is not limited to, exacerbation of chronic cyclical vomiting versus cannabinoid hyperemesis, viral illness, much lower suspicion significant acute intra-abdominal process given history of similar and physical exam  Patient's presentation is most consistent with acute presentation with potential threat to life or bodily function.  32 year old male presenting to the emergency department for evaluation of vomiting and abdominal pain.  Labs with stable mild acidosis, mild leukocytosis, normal lipase.  Electrolytes  within normal limits.  Patient reportedly with significant improvement with droperidol during ER visit here in July, so he was ordered for this.  Did obtain an EKG demonstrating normal QTc.  Patient with ongoing symptoms initially after multiple rounds of medicines, but ultimately did have improvement in his symptoms.  Has Zofran at home that he reports is ineffective.  Will DC with oral and rectal Phenergan.  Encouraged him to follow-up with GI.  Strict return precautions provided.  Patient discharged in stable condition.      FINAL CLINICAL IMPRESSION(S) / ED DIAGNOSES   Final diagnoses:  Nausea and vomiting, unspecified vomiting type  Generalized abdominal pain     Rx / DC Orders   ED Discharge Orders          Ordered    promethazine (PHENERGAN) 25 MG suppository  Every 6 hours PRN        09/23/22 1314    promethazine (PHENERGAN) 25 MG tablet  Every 6 hours PRN        09/23/22 1314             Note:  This document was prepared using Dragon voice recognition software and may include unintentional dictation errors.   Trinna Post, MD 09/23/22 [phone]

## 2022-09-23 NOTE — Discharge Instructions (Addendum)
You were seen in the Emergency Department today for evaluation of your abdominal pain. Fortunately, your exam and labs were reassuring against an emergency cause for your pain.  Given your ongoing symptoms, I do recommend follow-up with a GI specialist for further evaluation.  I have also sent a prescription for nausea medicine to your pharmacy that you can take as needed.  Return to the ER for any new or worsening symptoms including worsening pain, inability to tolerate food or liquids, or any other new or concerning symptoms.

## 2022-09-23 NOTE — ED Triage Notes (Signed)
Chronic abdominal pain patient (most recent ED visit was here yesterday) is here today for abdominal pain; Patient has never followed up with GI and does not take any daily medications

## 2023-04-21 IMAGING — CT CT ABD-PELV W/ CM
2 of 4 series · 16 of 46 positions shown, 18 images · IV contrast (agent unspecified)
Comparison: 10/11/2019 CT abdomen/pelvis.

CLINICAL DATA: Mid abdominal pain, nausea and vomiting

EXAM:
CT ABDOMEN AND PELVIS WITH CONTRAST
TECHNIQUE: Multidetector CT imaging of the abdomen and pelvis was performed
using the standard protocol following bolus administration of
intravenous contrast.

[Series 2: routine abd/pel with · axial · 0.72mm/px · z∈[-1232,-807]mm · 13 of 93 slices shown, 15 images]
[im 4/93  soft-tissue]
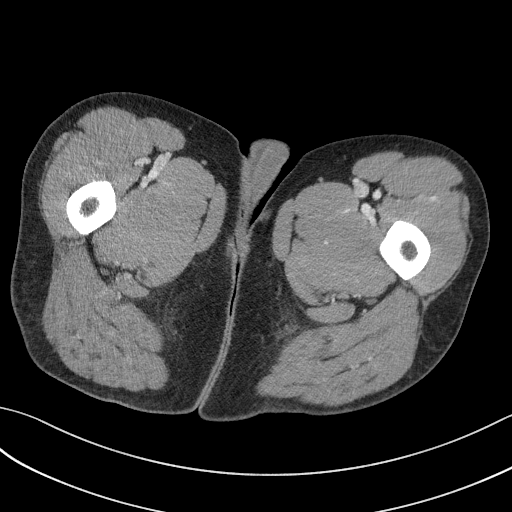
[im 4/93  bone]
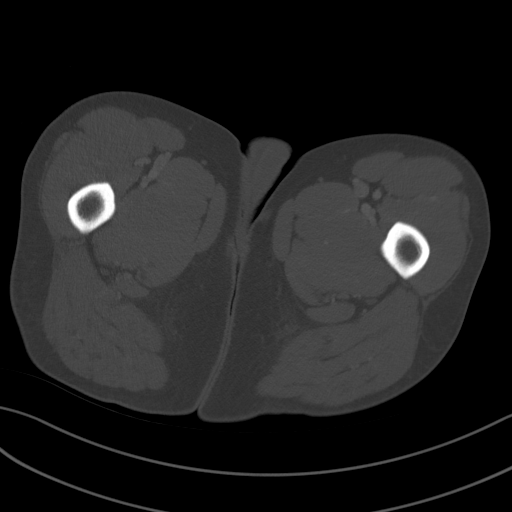
[im 12/93  soft-tissue]
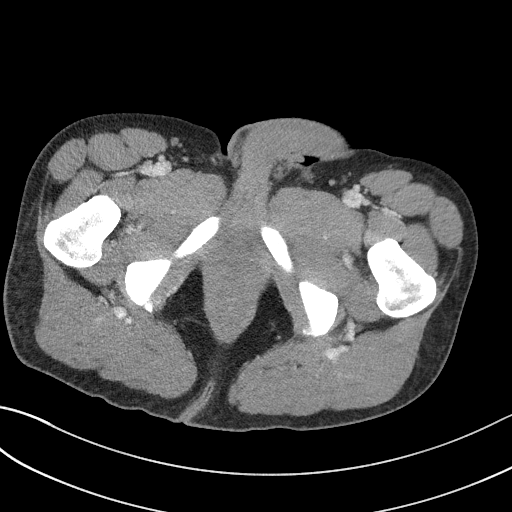
[im 20/93  soft-tissue]
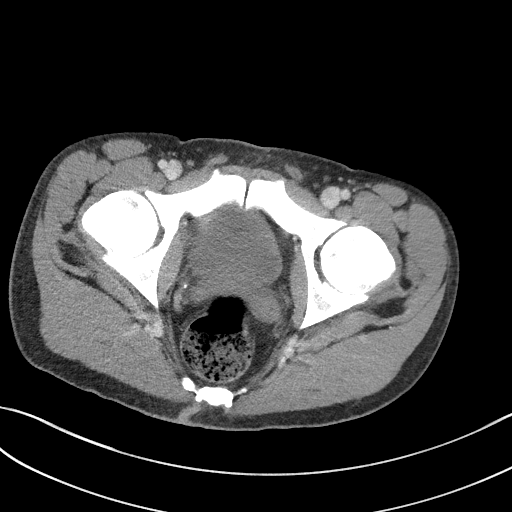
[im 27/93  soft-tissue]
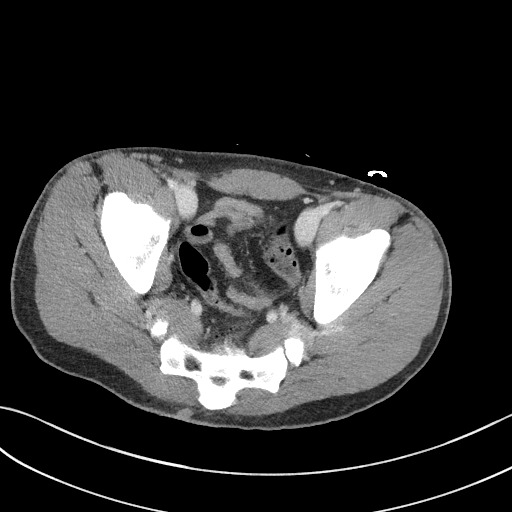
[im 31/93  soft-tissue]
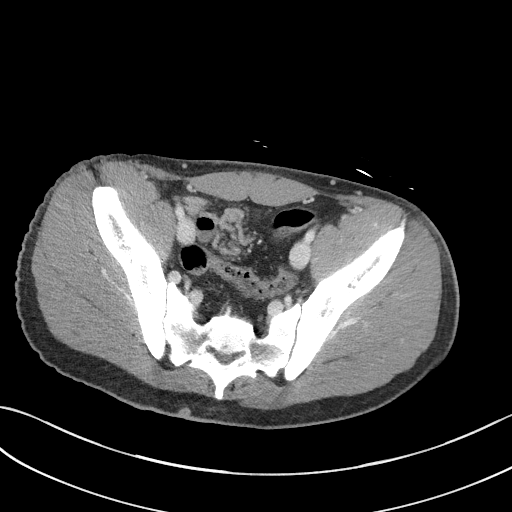
[im 39/93  soft-tissue]
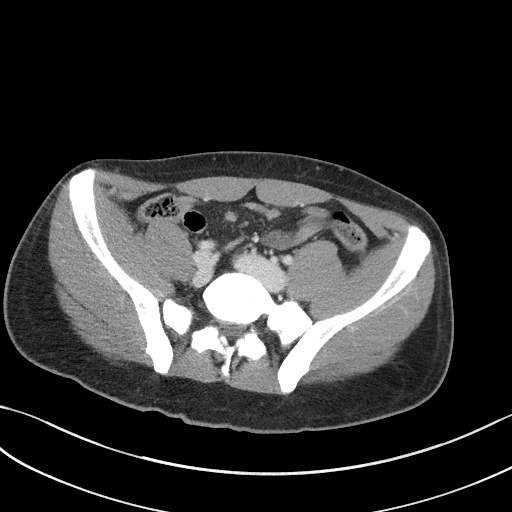
[im 47/93  soft-tissue]
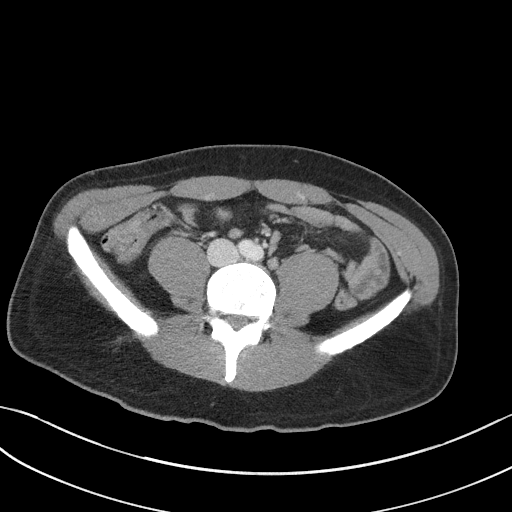
[im 54/93  soft-tissue]
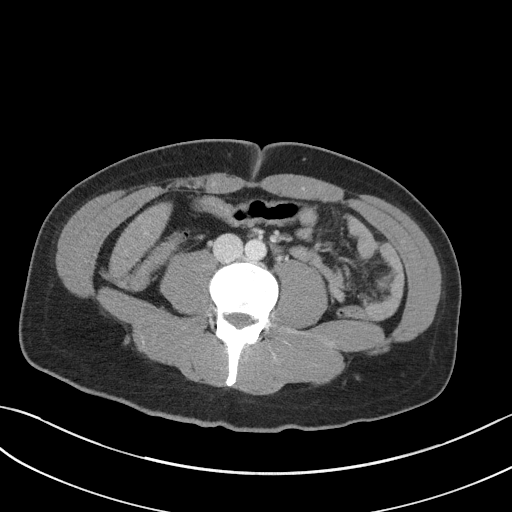
[im 62/93  soft-tissue]
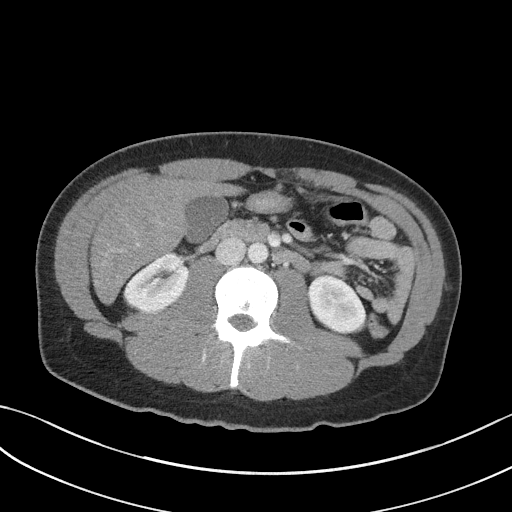
[im 62/93  bone]
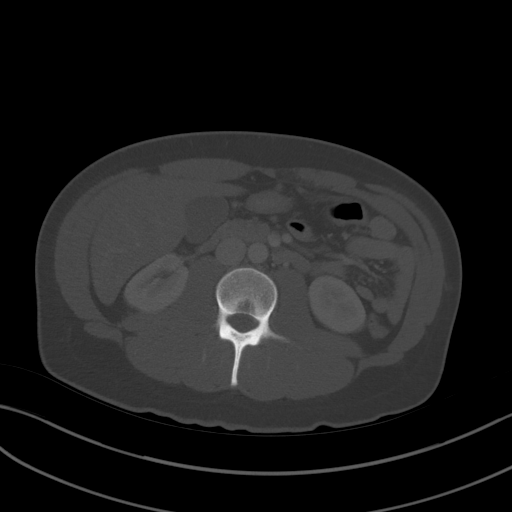
[im 66/93  soft-tissue]
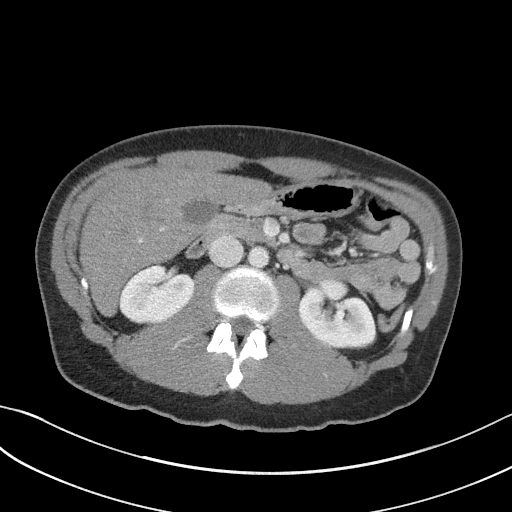
[im 73/93  soft-tissue]
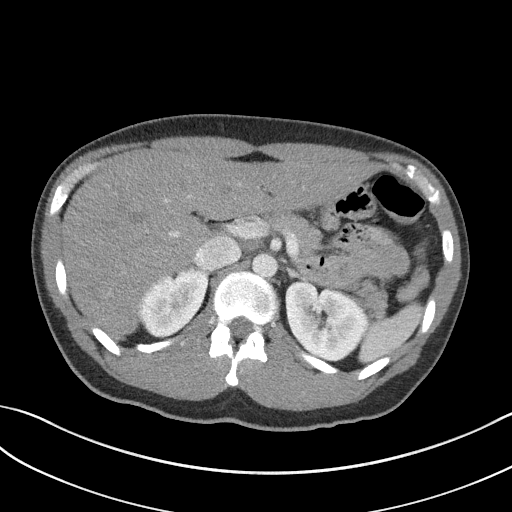
[im 81/93  soft-tissue]
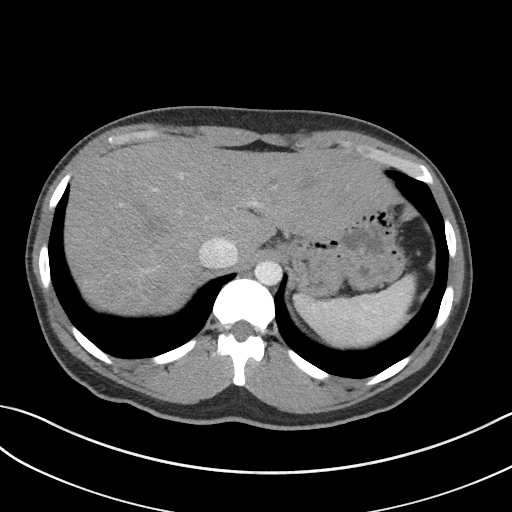
[im 89/93  soft-tissue]
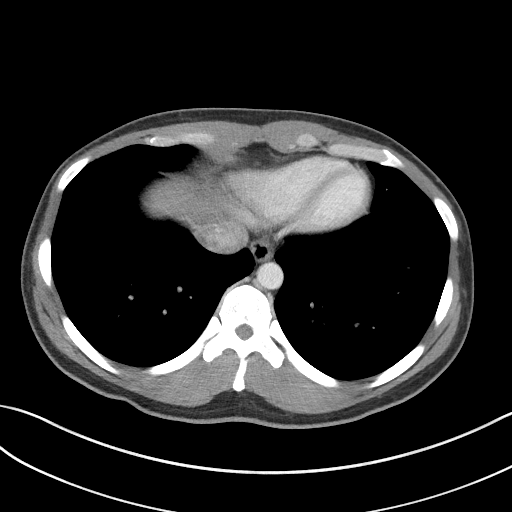

[Series 6: coronal st · coronal · 0.70mm/px · 3 of 81 slices shown]
[im 27/81  soft-tissue]
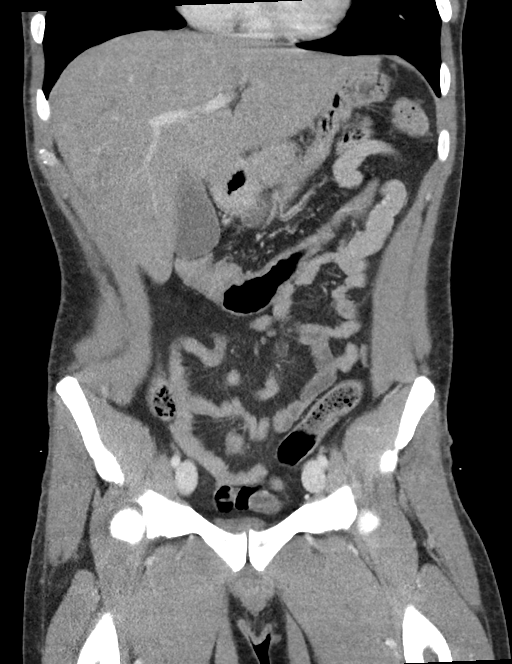
[im 36/81  soft-tissue]
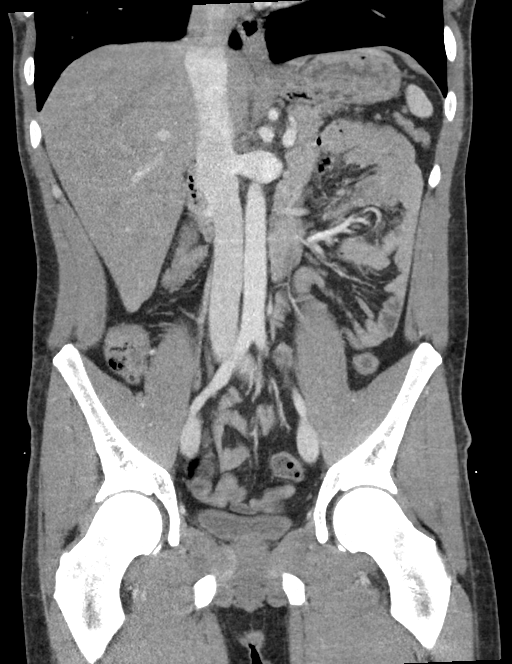
[im 45/81  soft-tissue]
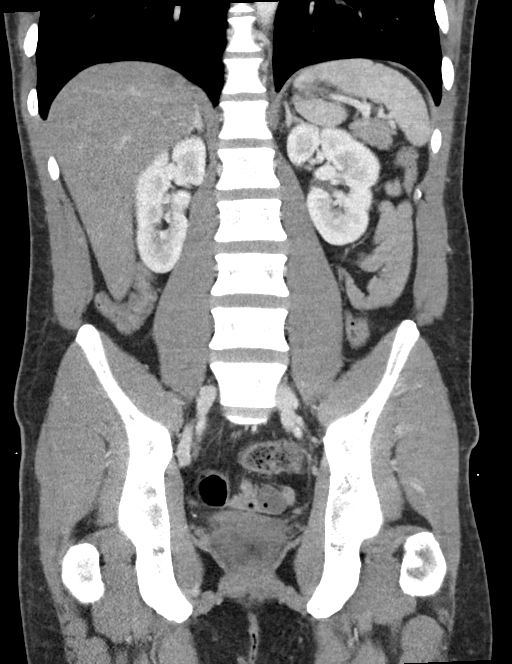

[16 of 46 positions shown; findings below may reference images not displayed]

RADIATION DOSE REDUCTION: This exam was performed according to the
departmental dose-optimization program which includes automated
exposure control, adjustment of the mA and/or kV according to
patient size and/or use of iterative reconstruction technique.

CONTRAST:  100mL OMNIPAQUE IOHEXOL 300 MG/ML  SOLN
FINDINGS: Lower chest: No significant pulmonary nodules or acute consolidative
airspace disease.

Hepatobiliary: Normal liver size. No liver mass. Normal gallbladder
with no radiopaque cholelithiasis. No biliary ductal dilatation.

Pancreas: Normal, with no mass or duct dilation.

Spleen: Normal size. No mass.

Adrenals/Urinary Tract: Normal adrenals. Normal kidneys with no
hydronephrosis and no renal mass. Normal bladder.

Stomach/Bowel: Normal non-distended stomach. Normal caliber small
bowel with no small bowel wall thickening. Normal appendix. Normal
large bowel with no diverticulosis, large bowel wall thickening or
pericolonic fat stranding.

Vascular/Lymphatic: Normal caliber abdominal aorta. Patent portal,
splenic and renal veins. No pathologically enlarged lymph nodes in
the abdomen or pelvis.

Reproductive: Normal size prostate.

Other: No pneumoperitoneum, ascites or focal fluid collection.

Musculoskeletal: No aggressive appearing focal osseous lesions.
IMPRESSION: No acute abnormality. No evidence of bowel obstruction or acute
bowel inflammation. Normal appendix.

## 2023-05-25 ENCOUNTER — Emergency Department
Admission: EM | Admit: 2023-05-25 | Discharge: 2023-05-25 | Disposition: A | Discharge status: Home / Self Care | Attending: Emergency Medicine | Admitting: Emergency Medicine

## 2023-05-25 ENCOUNTER — Other Ambulatory Visit: Payer: Self-pay

## 2023-05-25 ENCOUNTER — Emergency Department

## 2023-05-25 DIAGNOSIS — R112 Nausea with vomiting, unspecified: Secondary | ICD-10-CM | POA: Diagnosis not present

## 2023-05-25 DIAGNOSIS — R1010 Upper abdominal pain, unspecified: Secondary | ICD-10-CM | POA: Diagnosis present

## 2023-05-25 DIAGNOSIS — R1084 Generalized abdominal pain: Secondary | ICD-10-CM | POA: Insufficient documentation

## 2023-05-25 DIAGNOSIS — R1110 Vomiting, unspecified: Secondary | ICD-10-CM

## 2023-05-25 LAB — CBC
HCT: 38.9 % — ABNORMAL LOW (ref 39.0–52.0)
Hemoglobin: 13.7 g/dL (ref 13.0–17.0)
MCH: 31.5 pg (ref 26.0–34.0)
MCHC: 35.2 g/dL (ref 30.0–36.0)
MCV: 89.4 fL (ref 80.0–100.0)
Platelets: 227 10*3/uL (ref 150–400)
RBC: 4.35 MIL/uL (ref 4.22–5.81)
RDW: 11.3 % — ABNORMAL LOW (ref 11.5–15.5)
WBC: 10.1 10*3/uL (ref 4.0–10.5)
nRBC: 0 % (ref 0.0–0.2)

## 2023-05-25 LAB — URINALYSIS, ROUTINE W REFLEX MICROSCOPIC
Bilirubin Urine: NEGATIVE
Glucose, UA: NEGATIVE mg/dL
Hgb urine dipstick: NEGATIVE
Ketones, ur: NEGATIVE mg/dL
Leukocytes,Ua: NEGATIVE
Nitrite: NEGATIVE
Protein, ur: NEGATIVE mg/dL
Specific Gravity, Urine: 1.029 (ref 1.005–1.030)
pH: 8 (ref 5.0–8.0)

## 2023-05-25 LAB — COMPREHENSIVE METABOLIC PANEL WITH GFR
ALT: 13 U/L (ref 0–44)
AST: 21 U/L (ref 15–41)
Albumin: 4.1 g/dL (ref 3.5–5.0)
Alkaline Phosphatase: 38 U/L (ref 38–126)
Anion gap: 11 (ref 5–15)
BUN: 15 mg/dL (ref 6–20)
CO2: 19 mmol/L — ABNORMAL LOW (ref 22–32)
Calcium: 9 mg/dL (ref 8.9–10.3)
Chloride: 107 mmol/L (ref 98–111)
Creatinine, Ser: 1.17 mg/dL (ref 0.61–1.24)
GFR, Estimated: >60 mL/min (ref >60)
Glucose, Bld: 191 mg/dL — ABNORMAL HIGH (ref 70–99)
Potassium: 3.6 mmol/L (ref 3.5–5.1)
Sodium: 137 mmol/L (ref 135–145)
Total Bilirubin: 0.6 mg/dL (ref 0.0–1.2)
Total Protein: 7.2 g/dL (ref 6.5–8.1)

## 2023-05-25 LAB — LIPASE, BLOOD: Lipase: 35 U/L (ref 11–51)

## 2023-05-25 MED ORDER — HYDROMORPHONE HCL 1 MG/ML IJ SOLN
1.0000 mg | INTRAMUSCULAR | Status: AC
  Administered 2023-05-25: 1 mg via INTRAVENOUS
  Filled 2023-05-25: qty 1

## 2023-05-25 MED ORDER — CAPSAICIN 0.025 % EX CREA
TOPICAL_CREAM | CUTANEOUS | Status: DC
  Filled 2023-05-25 (×2): qty 60

## 2023-05-25 MED ORDER — SODIUM CHLORIDE 0.9 % IV BOLUS
1000.0000 mL | Freq: Once | INTRAVENOUS | Status: AC
  Administered 2023-05-25: 1000 mL via INTRAVENOUS

## 2023-05-25 MED ORDER — DROPERIDOL 2.5 MG/ML IJ SOLN
1.2500 mg | Freq: Once | INTRAMUSCULAR | Status: AC
  Administered 2023-05-25: 1.25 mg via INTRAVENOUS
  Filled 2023-05-25: qty 2

## 2023-05-25 MED ORDER — ONDANSETRON HCL 4 MG/2ML IJ SOLN
4.0000 mg | INTRAMUSCULAR | Status: AC
  Administered 2023-05-25: 4 mg via INTRAVENOUS
  Filled 2023-05-25: qty 2

## 2023-05-25 MED ORDER — ONDANSETRON HCL 4 MG/2ML IJ SOLN
4.0000 mg | Freq: Once | INTRAMUSCULAR | Status: AC
  Administered 2023-05-25: 4 mg via INTRAVENOUS
  Filled 2023-05-25: qty 2

## 2023-05-25 MED ORDER — HYDROMORPHONE HCL 1 MG/ML IJ SOLN
0.5000 mg | INTRAMUSCULAR | Status: AC
  Administered 2023-05-25: 0.5 mg via INTRAVENOUS
  Filled 2023-05-25: qty 1

## 2023-05-25 MED ORDER — IOHEXOL 300 MG/ML  SOLN
100.0000 mL | Freq: Once | INTRAMUSCULAR | Status: AC | PRN
  Administered 2023-05-25: 100 mL via INTRAVENOUS

## 2023-05-25 MED ORDER — METOCLOPRAMIDE HCL 5 MG/ML IJ SOLN
10.0000 mg | Freq: Once | INTRAMUSCULAR | Status: AC
  Administered 2023-05-25: 10 mg via INTRAVENOUS
  Filled 2023-05-25: qty 2

## 2023-05-25 MED ORDER — HYDROMORPHONE HCL 1 MG/ML IJ SOLN
0.5000 mg | INTRAMUSCULAR | Status: DC
  Filled 2023-05-25: qty 1

## 2023-05-25 NOTE — ED Notes (Signed)
 Called CT to inform that patient is ready for CT at this time

## 2023-05-25 NOTE — Discharge Instructions (Signed)
 Please avoid any marijuana or products that have cannabinoids in it.

## 2023-05-25 NOTE — ED Provider Notes (Signed)
 [facility] Provider Note    Event Date/Time   First MD Initiated Contact with Patient 05/25/23 [phone]     (approximate)   History   Abdominal Pain   HPI  Ethan Schultz is a 33 y.o. male with a history of prior appendectomy.  Primary care note from January of this year denotes a history of recurrent episodes of nausea vomiting abdominal pain.   Patient reports this morning he started having severe nausea vomiting abdominal pain.  He reports that he started having severe sudden upper abdominal pain with vomiting.  He is also had loose stool.  He has had this happen many times.  He reports the pain is so severe he cannot lay still.  He also relates that he has been trying to cut back but still uses marijuana regularly, but still uses  Patient relates he has had this happen to him several times in the past.  Was seen at [facility] ER prior  Records reviewed patient noted to be evaluated at [facility] ER January 30 at that time concern for hyperemesis syndrome due to cannabinoid use with suspect    No fevers or chills.  Reports nausea and vomiting.  Allergic to penicillin and orange dye  Physical Exam   Triage Vital Signs: ED Triage Vitals  Encounter Vitals Group     BP 05/25/23 0933 (!) 123/96     Systolic BP Percentile --      Diastolic BP Percentile --      Pulse Rate 05/25/23 0933 69     Resp 05/25/23 0933 (!) 22     Temp 05/25/23 0933 97.8 F (36.6 C)     Temp Source 05/25/23 0933 Oral     SpO2 05/25/23 0933 100 %     Weight 05/25/23 0932 175 lb 0.7 oz (79.4 kg)     Height 05/25/23 0932 5\' 10"  (1.778 m)     Head Circumference --      Peak Flow --      Pain Score 05/25/23 0932 10     Pain Loc --      Pain Education --      Exclude from Growth Chart --     Most recent vital signs: Vitals:   05/25/23 1250 05/25/23 1330  BP: 102/61 113/60  Pulse: 77 94  Resp: 18 18  Temp:    SpO2: 96% 97%     General: Awake, no distress.  CV:  Good  peripheral perfusion.  Resp:  Normal effort.  Abd:  No distention.  Patient reports abdominal tenderness throughout on palpation.  Hard to locate anyone focality but he does point towards the epigastrium being more prominent.  There is no distention.  There is no scars or open wounds.  There is no obvious hernias or masses Other:  Patient is writhing about in the bed back-and-forth, asking for pain medication.  He appears quite uncomfortable with frequent gagging but no active emesis   ED Results / Procedures / Treatments   Labs (all labs ordered are listed, but only abnormal results are displayed) Labs Reviewed  COMPREHENSIVE METABOLIC PANEL WITH GFR - Abnormal; Notable for the following components:      Result Value   CO2 19 (*)    Glucose, Bld 191 (*)    All other components within normal limits  CBC - Abnormal; Notable for the following components:   HCT 38.9 (*)    RDW 11.3 (*)    All other components within  normal limits  URINALYSIS, ROUTINE W REFLEX MICROSCOPIC - Abnormal; Notable for the following components:   Color, Urine STRAW (*)    APPearance CLEAR (*)    All other components within normal limits  LIPASE, BLOOD     EKG     RADIOLOGY  CT abdomen pelvis pending at time of signout to Dr. Jodie Echevaria    PROCEDURES:  Critical Care performed: No  Procedures   MEDICATIONS ORDERED IN ED: Medications  capsaicin (ZOSTRIX) 0.025 % cream ( Topical Patient Refused/Not Given 05/25/23 1457)  droperidol (INAPSINE) 2.5 MG/ML injection 1.25 mg (has no administration in time range)  HYDROmorphone (DILAUDID) injection 1 mg (1 mg Intravenous Given 05/25/23 0955)  ondansetron (ZOFRAN) injection 4 mg (4 mg Intravenous Given 05/25/23 0955)  sodium chloride 0.9 % bolus 1,000 mL (1,000 mLs Intravenous New Bag/Given 05/25/23 0956)  iohexol (OMNIPAQUE) 300 MG/ML solution 100 mL (100 mLs Intravenous Contrast Given 05/25/23 1335)  HYDROmorphone (DILAUDID) injection 0.5 mg (0.5 mg Intravenous Given  05/25/23 1126)  ondansetron (ZOFRAN) injection 4 mg (4 mg Intravenous Given 05/25/23 1127)  metoCLOPramide (REGLAN) injection 10 mg (10 mg Intravenous Given 05/25/23 1208)     IMPRESSION / MDM / ASSESSMENT AND PLAN / ED COURSE  I reviewed the triage vital signs and the nursing notes.                              Differential diagnosis includes but is not limited to, abdominal perforation, aortic dissection, cholecystitis, appendicitis, diverticulitis, colitis, esophagitis/gastritis, kidney stone, pyelonephritis, urinary tract infection, aortic aneurysm. All are considered in decision and treatment plan. Based upon the patient's presentation and risk factors, cannabinoid hyperemesis or recurrent cyclical vomiting given his previous history is strongly considered, pancreatitis, perforated ulcer, gastroenteritis, etc.  He has no hemodynamic instability or fever but certainly appears in pain writhing back-and-forth reporting severe pain primarily in the epigastrium.  Will medicate hydrate and observe closely.  Given the degree of pain and discomfort, have ordered labs and CT imaging to evaluate and exclude acute intra-abdominal process.  Will also trial capsaicin   Patient's presentation is most consistent with acute complicated illness / injury requiring diagnostic workup.      Clinical Course as of 05/25/23 1605  Wed May 25, 2023  1153 Patient has received 1/2 mg total of hydromorphone and 8 mg Zofran.  Patient continues to report intractable pain and nausea [MQ]  1154 He is fully awake and alert at this time.  He refused CT scan reporting too much pain [MQ]  1243 Patient alert to voice.  Resting comfortably.  Agreeable to CT scan at this time.  No further emesis.  Pain appears to be improving.  Additional dose of hydromorphone canceled as pain now controlled with [MQ]  1545 CT ABDOMEN PELVIS W CONTRAST No acute intra-abdominal or pelvic pathology.  [TT]  1548 On reassessment patient is abdomen  soft but he still feels nauseous, states he needs something additional before he trials p.o.  Will give him some IV droperidol and reassess. [TT]    Clinical Course User Index [MQ] Sharyn Creamer, MD [TT] Jodie Echevaria Franchot Erichsen, MD   Ongoing care assigned to Dr. Jodie Echevaria at approximately 3:30 PM.  Plan for reassessment and follow-up on CT results.  At this point suspect patient likely suffering from cannabinoid induced hyperemesis or a cyclical vomiting type syndrome but awaiting imaging to affirm no acute intra-abdominal pathology  FINAL CLINICAL IMPRESSION(S) / ED DIAGNOSES  Final diagnoses:  Generalized abdominal pain  Recurrent vomiting     Rx / DC Orders   ED Discharge Orders     None        Note:  This document was prepared using Dragon voice recognition software and may include unintentional dictation errors.   Sharyn Creamer, MD 05/25/23 1606

## 2023-05-25 NOTE — ED Notes (Addendum)
 Pt verbalizes understanding of discharge instructions. Opportunity for questioning and answers were provided. Pt discharged from ED to home with family.

## 2023-05-25 NOTE — ED Triage Notes (Signed)
 Pt here via [facility] with abd pain that started at 0500 today. Pt was not able to ambulate to car, pt moaning on arrival to ED. Pt endorses diarrhea and nausea.   60 100%  127/80

## 2023-05-25 NOTE — ED Provider Notes (Signed)
.-----------------------------------------   3:28 PM on 05/25/2023 -----------------------------------------  Blood pressure 102/61, pulse 77, temperature 97.8 F (36.6 C), temperature source Oral, resp. rate 18, height 5\' 10"  (1.778 m), weight 79.4 kg, SpO2 96%.  Assuming care from Dr. Fanny Bien.  In short, Ethan Schultz is a 33 y.o. male with a chief complaint of Abdominal Pain .  Refer to the original H&P for additional details.  The current plan of care is to CT a/p, possible cannabinoid hyperemesis.  Independent review of labs and imaging as well as clinical course as below.  Considered but no indication for inpatient admission at this time, he is safe for outpatient management.  Patient is requesting to leave, states that his ride is here, states his symptoms have improved.  Given the ginger ale that he tolerated.  Considered but no indication for inpatient admission at this time, he is safe for outpatient management.  Advised him to avoid marijuana and cannabinoid use.  Instructed to follow-up with primary care doctor in 2 to 3 days to get reassessed.  Strict precautions given.  Discharge.  Clinical Course as of 05/25/23 1717  Wed May 25, 2023  1153 Patient has received 1/2 mg total of hydromorphone and 8 mg Zofran.  Patient continues to report intractable pain and nausea [MQ]  1154 He is fully awake and alert at this time.  He refused CT scan reporting too much pain [MQ]  1243 Patient alert to voice.  Resting comfortably.  Agreeable to CT scan at this time.  No further emesis.  Pain appears to be improving.  Additional dose of hydromorphone canceled as pain now controlled with [MQ]  1545 CT ABDOMEN PELVIS W CONTRAST No acute intra-abdominal or pelvic pathology.  [TT]  1548 On reassessment patient is abdomen soft but he still feels nauseous, states he needs something additional before he trials p.o.  Will give him some IV droperidol and reassess. [TT]  1715 On reassessment patient states that  he is feeling a lot better, wants to go home, states his ride is here.  Tolerated p.o.  Will discharge with strict precautions. [TT]    Clinical Course User Index [MQ] Sharyn Creamer, MD [TT] Jodie Echevaria Franchot Erichsen, MD      Claybon Jabs, MD 05/25/23 [phone]

## 2023-09-27 NOTE — ED Provider Notes (Signed)
 Initial Provider Assessment  Interpreter used: Level of Interpreter Services: No interpreter needed (no language barrier) Historian: patient  HPI: Pt is a 33 y.o. male with history as listed below who presents to the ED with No chief complaint on file.  Patient presenting with generalized abdominal pain and vomiting since this morning. History limited by patient actively vomiting throughout evaluation.  Past Medical History:  Diagnosis Date  . Appendicitis 2009   post appendectomy and post op complication with bowel leak.  . Sepsis (CMS/HHS-HCC)    after appendectomy    Past Surgical History:  Procedure Laterality Date  . APPENDECTOMY  06/05/2007     Vitals:   09/27/23 1108  Pulse: 60  Resp: 18  Temp: 36.2 C (97.2 F)  TempSrc: Tympanic  SpO2: 100%  Weight: 81.6 kg (180 lb)   Focused Physical Exam: Gen: appears unwell, actively vomiting HEENT: Normocephalic and atraumatic CV: Regular rate Lung: No respiratory distress Abd: Soft, epigastric tenderness, non-distended Neuro: Alert and awake, moving all extremities well  Medical Decision Making and Plan: Given the patient's initial provider assessment, the following diagnostic evaluation and therapeutic interventions have been ordered. The patient will be placed in the appropriate treatment space, once one is available, to complete the evaluation and treatment.  I have discussed the plan of care with the patient. Prior notes reviewed: no Tests ordered:  Orders Placed This Encounter  Procedures  . Lipase  . Comprehensive Metabolic Panel (CMP)  . Complete Blood Count (CBC) with Differential  . Culture, Urine, Routine with Pyuria Screen (reflexed from Urinalysis)  . DIET NPO Effective Now  . ECG 12-lead  . Peripheral IV Access, Obtain & Maintain- Adult   Treatments ordered: Medications - No data to display  Bed czar notified of need for room placement   Teresa Perkins, MD 09/27/23 1132

## 2023-09-28 NOTE — ED Provider Notes (Signed)
 [facility] EMERGENCY DEPT  ED Provider Note History  No chief complaint on file.   History of Present Illness  HPI 33 year old male with history of cyclic vomiting/cannabinoid hyperemesis, who is being evaluated at ED multiple time for episodes of nausea vomiting and abdominal discomfort, presents to the emergency room with generalized abdominal pain associated with nausea vomiting since yesterday morning.  Patient reports he has been spending almost 2 weeks at the hospital with his daughter who is currently admitted to pediatric floor, reports increased stress that provoked his abdominal discomfort nausea and vomiting, denies suspicious food intake, no fever no chill.  Reports having hard time tolerating food.  Denies suspicious food intake, no recent travel no sick contacts.   Level of Interpreter Services: No interpreter needed (no language barrier)  Past Medical History:  Diagnosis Date  . Appendicitis 2009   post appendectomy and post op complication with bowel leak.  . Sepsis (CMS/HHS-HCC)    after appendectomy   Past Surgical History:  Procedure Laterality Date  . APPENDECTOMY  06/05/2007   Family History  Problem Relation Age of Onset  . High blood pressure (Hypertension) Mother   . Other Father        MVA  . No Known Problems Brother    Social History   Socioeconomic History  . Marital status: Single  . Number of children: 0  . Highest education level: 10th grade  Occupational History  . 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    Comment: he does a lot of everything doing Holiday representative.  . Occupation: Medical illustrator.  [employer].  Tobacco Use  . Smoking status: Former    Types: Cigarettes  . Smokeless tobacco: Never  . Tobacco comments:    black and milds.  2 a day.  Vaping Use  . Vaping status: Some Days  Substance and Sexual Activity  . Alcohol use: Not Currently  . Drug use: Yes    Types: Marijuana    Comment: 2 blunts a  day.  SABRA Sexual activity: Yes    Partners: Female  Social History Narrative   10 th grade education.   Social Drivers of Corporate investment banker Strain: Low Risk  (03/09/2023)   Overall Financial Resource Strain (CARDIA)   . Difficulty of Paying Living Expenses: Not very hard  Food Insecurity: No Food Insecurity (03/09/2023)   Hunger Vital Sign   . Worried About Programme researcher, broadcasting/film/video in the Last Year: Never true   . Ran Out of Food in the Last Year: Never true  Transportation Needs: No Transportation Needs (03/09/2023)   PRAPARE - Transportation   . Lack of Transportation (Medical): No   . Lack of Transportation (Non-Medical): No  Housing Stability: Low Risk  (03/09/2023)   Housing Stability Vital Sign   . Unable to Pay for Housing in the Last Year: No   . Number of Times Moved in the Last Year: 0   . Homeless in the Last Year: No   Review of Systems  Constitutional:  Negative for chills, fatigue and fever.  Respiratory: Negative.  Negative for chest tightness.   Cardiovascular:  Negative for chest pain.  Gastrointestinal:  Positive for abdominal pain, nausea and vomiting. Negative for constipation and diarrhea.  Genitourinary: Negative.   Musculoskeletal: Negative.   Neurological: Negative.   All other systems reviewed and are negative.   Physical Exam  BP 118/71   Pulse 57   Temp 36.7 C (98.1 F) (Oral) Comment:  pt refused vitals; now ready to get  Resp 18   Wt 81.6 kg (180 lb)   SpO2 100%   BMI 25.83 kg/m  Physical Exam Vitals reviewed.  Constitutional:      Appearance: Normal appearance. He is not ill-appearing.  HENT:     Head: Normocephalic and atraumatic.  Eyes:     Extraocular Movements: Extraocular movements intact.     Pupils: Pupils are equal, round, and reactive to light.  Cardiovascular:     Rate and Rhythm: Normal rate.  Pulmonary:     Effort: Pulmonary effort is normal.  Abdominal:     Tenderness: There is abdominal tenderness. There is no right  CVA tenderness or left CVA tenderness.  Musculoskeletal:        General: Normal range of motion.  Skin:    General: Skin is warm.  Neurological:     General: No focal deficit present.     Mental Status: He is alert and oriented to person, place, and time.     Cranial Nerves: No cranial nerve deficit.     Motor: No weakness.  Psychiatric:        Mood and Affect: Mood normal.    Physical Exam    Results  Medical Decision Making   Medical Decision Making     Assessment and Plan:     33 year old male with history of cyclic vomiting/cannabinoid hyperemesis, who is being evaluated at ED multiple time for episodes of nausea vomiting and abdominal discomfort, presents to the emergency room with generalized abdominal pain associated with nausea vomiting since yesterday morning. Basic labs CT abd  IV hydration Antiemetics Pain control p.o. tramadol Differential Diagnosis:    In addition to the differential diagnoses listed, other diagnoses were considered.      Was diagnosed included but not limited to cannabinoid induced hyperemesis, presentation in the past, patient reports having been smoking since his daughter in the hospital approximately 2 weeks, less likely infectious etiology, CT abdomen completed no acute findings no concern for any abdominal pathology, consider dehydration given decreased p.o. intake and multiple episodes of vomiting Medical Complexity:    New and requires workup.     Pertinent labs & imaging results that were available during my care of the patient were reviewed by me and considered in my medical decision making.     I reviewed previous medical records.     I independently visualized image(s), tracing(s), and/or specimen(s).      Medications Administered in the Emergency Department   aluminum & magnesium hydroxide-simethicone (DUH/DRH) (MYLANTA MAXIMUM) 400-400-40 mg/5 mL suspension 15 mL (has no administration in time range) famotidine (PF) (PEPCID)  injection 20 mg (has no administration in time range) promethazine  in 0.9 % NaCl (PHENERGAN ) 12.5 mg/50 mL IVPB 12.5 mg (has no administration in time range) ketorolac  (TORADOL ) injection 15 mg (has no administration in time range) lactated ringers  bolus 1,000 mL (0 mLs Intravenous Stopped 09/27/23 1201) ondansetron  (PF) (ZOFRAN ) injection 4 mg (4 mg Intravenous Given 09/27/23 1134) iopamidoL (ISOVUE-370) 76% injection 125 mL (125 mLs Intravenous Given 09/27/23 1258) ondansetron  (PF) (ZOFRAN ) injection 4 mg (4 mg Intravenous Given 09/27/23 1656) ketorolac  (TORADOL ) injection 15 mg (15 mg Intravenous Given 09/27/23 1656) HYDROmorphone  (DILAUDID ) 0.5 mg/0.5 mL inj syringe 0.5 mg (0.5 mg Intravenous Given 09/27/23 1656)   ED Clinical Impression  1. Nausea and vomiting, unspecified vomiting type        Imaging (my interpretation): Reviewed CT abdomen no significant finding on images  Labs: Labs  notable for toxicology with ethanol less than 5, lipase 30, CMP overall unremarkable no significant electrolyte abnormality creatinine at baseline 1.2 CBC with no WBC elevation, hemoglobin 12.5 similar to previous.  Chart/record review: Patient has multiple episodes of nausea vomiting in setting of cannabinoid use.  Last evaluated 05/25/2023   3:37 AM Attempted p.o. trial after antiemetic, and pain medication, however patient refused to report severe pain nausea.  Patient is crying at this time.  Will treat with Haldol  and capsaicin  cream and reevaluate  5:56 AM Patient reports he still feels unwell, however no episodes of emesis, cracker and drink, overall well-appearing.  Patient is safe to be discharged with strict precautions return to the emergency room and outpatient follow-up.   This note has been created using automated tools and reviewed for accuracy by SKY LAHAV-VITENZON.   AI Note Feedback-- [employer], [place], [place] 09/28/23 [phone]

## 2023-10-12 NOTE — ED Provider Notes (Addendum)
 Initial Provider Assessment  Interpreter used: Level of Interpreter Services: No interpreter needed (no language barrier) Historian: patient  HPI: Pt is a 33 y.o. male with history as listed below who presents to the ED with Abdominal Pain, Dysuria, and dark stools    Past Medical History:  Diagnosis Date  . Appendicitis 2009   post appendectomy and post op complication with bowel leak.  . Sepsis (CMS/HHS-HCC)    after appendectomy    Past Surgical History:  Procedure Laterality Date  . APPENDECTOMY  06/05/2007     Vitals:   10/12/23 1135  BP: 122/88  Pulse: 61  Resp: (!) 28  Temp: 36.9 C (98.5 F)  SpO2: 99%   Focused Physical Exam: Gen: No Acute Distress HEENT: Normocephalic and atraumatic CV: Regular rate Lung: No respiratory distress Abd: Diffuse tenderness, greatest in the epigastric and suprapubic regions Neuro: Alert and awake, moving all extremities well  Medical Decision Making and Plan: Given the patient's initial provider assessment, the following diagnostic evaluation and therapeutic interventions have been ordered. The patient will be placed in the appropriate treatment space, once one is available, to complete the evaluation and treatment.  I have discussed the plan of care with the patient. Prior notes reviewed: no Tests ordered:  Orders Placed This Encounter  Procedures  . CT abdomen pelvis with contrast  . Comprehensive Metabolic Panel (CMP)  . Lipase  . Complete Blood Count (CBC) with Differential  . Culture, Urine, Routine with Pyuria Screen (reflexed from Urinalysis)  . Magnesium  . DIET NPO Except for: MEDS Effective Now  . Type And Screen  . Peripheral IV Access, Obtain & Maintain- Adult   Treatments ordered:  Medications  lidocaine  (XYLOCAINE ) 1 % injection 0.5 mL (has no administration in time range)  HYDROmorphone  (DILAUDID ) 0.5 mg/0.5 mL inj syringe 0.5 mg (has no administration in time range)  ondansetron  (PF) (ZOFRAN ) injection 4 mg  (has no administration in time range)     Coaxum, Tinnie Nian, MD 10/12/23 1222    Coaxum, Tinnie Nian, MD 10/12/23 1223    Coaxum, Tinnie Nian, MD 10/12/23 1223  Notified by the RN that the patient wanted to leave AMA.  Had an AMA discussion with the patient.  He endorsed understanding.  IV was removed.  He signed the paperwork and left AMA.    Sofie Tinnie Nian, MD 10/12/23 1426

## 2023-10-14 NOTE — ED Provider Notes (Addendum)
 Initial Provider Assessment  Interpreter used: no Historian: patient  HPI: Pt is a 33 y.o. male with history as listed below who presents to the ED with Abdominal Pain. Approx 7 days of pain.  LLQ most tender.  Most recent MJ use 1 week prior per pt.   Past Medical History:  Diagnosis Date  . Appendicitis 2009   post appendectomy and post op complication with bowel leak.  . Sepsis (CMS/HHS-HCC)    after appendectomy    Past Surgical History:  Procedure Laterality Date  . APPENDECTOMY  06/05/2007     Vitals:   10/14/23 0945  BP: 120/81  BP Location: Right upper arm  Patient Position: Lying  Pulse: 62  Resp: 16  Temp: 37.3 C (99.1 F)  TempSrc: Tympanic  SpO2: 97%   Focused Physical Exam: Gen: Mild distress HEENT: Normocephalic and atraumatic CV: Regular rate Lung: No respiratory distress Abd: Soft, focally TTP in LLQ, non-distended Neuro: Alert and awake, moving all extremities well  Medical Decision Making and Plan: Given the patient's initial provider assessment, the following diagnostic evaluation and therapeutic interventions have been ordered. The patient will be placed in the appropriate treatment space, once one is available, to complete the evaluation and treatment.  I have discussed the plan of care with the patient. Prior notes reviewed: yes: h/o CHE Tests ordered:  Orders Placed This Encounter  Procedures  . CT abdomen pelvis with contrast  . Lipase  . Comprehensive Metabolic Panel (CMP)  . Complete Blood Count (CBC) with Differential  . Culture, Urine, Routine with Pyuria Screen (reflexed from Urinalysis)  . Culture, Urine, Routine with Pyuria Screen (reflexed from Urinalysis)  . DIET NPO Effective Now  . ECG 12-lead  . Type And Screen  . Peripheral IV Access, Obtain & Maintain- Adult   Treatments ordered:  Medications  lidocaine  (XYLOCAINE ) 1 % injection 0.5 mL (has no administration in time range)  haloperidol  lactate (HALDOL ) injection 2 mg  (has no administration in time range)  sodium chloride  0.9 % bolus 1,000 mL (has no administration in time range)  fentaNYL  (PF) (SUBLIMAZE ) injection 50 mcg (has no administration in time range)     Marnie Prentice Pina, MD 10/14/23 1452  10/18/2023 10:21 AM Left AMA.  Labs stable.    Marnie Prentice Pina, MD 10/18/23 1021

## 2023-10-16 NOTE — ED Provider Notes (Signed)
 [facility] Emergency Department Provider Note   HPI   History of Present Illness Ethan Schultz is a 33 year old male with a history of sickle vomiting/cannabis hyperemesis syndrome, appendectomy complicated sepsis, by who presents with difficulty urinating and severe left-sided flank and abdominal pain.  He has been experiencing intermittent difficulty/pain with urinating and severe left-sided pain for the past four days. The pain is persistent and severe, but episodic with waxing and waning severity.  Patient has these episodes somewhat frequently, ...every 3 months or so.  He experiences cold shivers that come and go. He has not identified any specific triggers or alleviating factors for the pain.  He is experiencing nausea and dry heaves but no active vomiting.  He has not experienced any diarrhea.  He does not recall his last bowel movement.    Per chart review the patient has been seen frequently at [facility] including on 8/12, 8/13, 8/27, and 8/29 for similar complaints.  Haldol  IV appeared to improve his symptoms on several occasions.  At times he left prior to complete evaluation.  He did have a CT scan on 09/27/2023 that showed no acute findings.    Physical Exam   BP 121/93   Pulse 63   Temp 36.7 C (98.1 F)   Resp 18   Wt 86.2 kg (190 lb 0.6 oz)   SpO2 100%   BMI 28.06 kg/m    Constitutional: Uncomfortable appearing. Eyes: Conjunctivae are normal. EOMI.  HEENT: Normocephalic and atraumatic. Mucous membranes are moist.  Neck: Full active range of motion Cardiovascular: See heart rate listed above.  No murmurs appreciated.  Normal skin perfusion.  Respiratory: See respiratory rate listed above.  Lungs are clear to auscultation bilaterally.  Speaking easily in full sentences Gastrointestinal: Diffuse abdominal tenderness most notable in the left upper and left lower quadrants.  Patient also complains of left CVA tenderness.  No rebound tenderness, no  peritonitis. Musculoskeletal: No long bone deformities.  Intact distal pulses in all extremities. Neurologic: Normal speech and language. No gross focal neurologic deficits are appreciated.  Skin: Skin is warm, dry and intact. Psychiatric: Mood and affect are normal.   MDM   This patient with a history of cyclical vomiting syndrome/cannabis hyperemesis syndrome presents with left flank and left abdominal pain associated with nausea and vomiting.  Symptoms do appear consistent with multiple recent episodes evaluated at [facility].  Differential includes cyclic vomiting syndrome/cannabis hyperemesis, pancreatitis, GERD, diverticulitis, small bowel obstruction, urinary tract infection, among other etiologies.  No testicular tenderness or pain suggestive of testicular torsion.  We will obtain the diagnostic workup noted below.  Initial symptomatic management with 2 mg Haldol  IV and ketorolac  IV.  Final disposition per pending diagnostics and reassessment. Please see ED course below for additional updates.  Orders Placed This Encounter  Procedures  . CBC w/ Differential  . Comprehensive Metabolic Panel  . Lipase Level  . Urinalysis with Microscopy with Culture Reflex  . Toxicology Screen, Urine  . Ethanol  . Insert peripheral IV    ED Course as of 10/16/23 1945  Sun Oct 16, 2023  9144 Patient is resting comfortably on reassessment after receiving Haldol  and ketorolac .  0918 CBC w/ Differential(!):   WBC 5.0  RBC 4.04(!)  HGB 12.6(!)  HCT 37.3(!)  MCV 92.5  MCH 31.3  MCHC 33.8  RDW 13.4  MPV 9.1  Platelet 224  Neutrophils % 67.1  Lymphocytes % 24.1  Monocytes % 8.3  Eosinophils %  0.2  Basophils % 0.3  Absolute Neutrophils 3.3  Absolute Lymphocytes 1.2  Absolute Monocytes  0.4  Absolute Eosinophils 0.0  Absolute Basophils  0.0  0918 Comprehensive Metabolic Panel(!):   Sodium 142  Potassium 3.6  Chloride 105  CO2 21.0  Anion Gap 16(!)  Bun 8(!)  Creatinine  1.04  BUN/Creatinine Ratio 8  eGFR CKD-EPI (2021) Male >90  Glucose 100  Calcium 9.6  Albumin 4.2  Total Protein 7.3  Total Bilirubin 0.9  SGOT (AST) 12  ALT <7(!)  Alkaline Phosphatase 45(!)  0918 Lipase Level:   Lipase 28  0918 Ethanol:   Alcohol, Ethyl <10  0950 Urinalysis with Microscopy with Culture Reflex(!):   Color, UA Colorless  Clarity, UA Clear  Spec Grav, UA 1.011  pH, UA 8.0  Leukocyte Esterase, UA Negative  Nitrite, UA Negative  Protein, UA Negative  Glucose, UA Negative  Ketones, UA 10 mg/dL(!)  Urobilinogen, UA <7.9 mg/dL  Bilirubin, UA Negative  Blood, UA Negative  RBC, UA <1  WBC, UA 1  Squam Epithel, UA <1  Bacteria, UA None Seen No hematuria suggesting concern for renal stone.  9049 Toxicology Screen, Urine(!):   Amphetamine Screen, Ur Negative  Barbiturate Screen, Ur Negative  Benzodiazepine Screen, Urine Negative  Cannabinoid Scrn, Ur Positive(!)  Methadone Screen, Urine Negative  Cocaine(Metab.)Screen, Urine Negative  Opiate Scrn, Ur Negative  Fentanyl  Screen, Ur Negative  Oxycodone Screen, Ur Negative  Buprenorphine, Urine Negative  1035 Patient reports his symptoms have improved.  He is ready for discharge.  Supportive care and return precautions provided.    The case was discussed with the attending physician who is in agreement with the above assessment and plan.  - Any discussion of this patient's case/presentation between myself and consultants, admitting teams, or other team members has been documented above. - Imaging and other studies, if performed, that were available during my care of the patient were independently reviewed and interpreted by me and considered in my medical decision making as documented above. - External records reviewed: As noted above - Consideration of admission, observation, transfer, or escalation of care: None   ED Clinical Impression   Final diagnoses:  Left sided abdominal pain (Primary)  Left flank  pain     Past History   PAST MEDICAL HISTORY/PAST SURGICAL HISTORY:  Past Medical History[1]  Past Surgical History[2]  MEDICATIONS:  No current facility-administered medications for this encounter.  Current Outpatient Medications:  .  famotidine (PEPCID) 20 MG tablet, Take 1 tablet (20 mg total) by mouth Two (2) times a day for 15 days., Disp: 30 tablet, Rfl: 0 .  haloperidoL  (HALDOL ) 5 MG tablet, Take 1 tablet (5 mg total) by mouth Three (3) times a day as needed., Disp: 15 tablet, Rfl: 0  ALLERGIES:  Penicillins  SOCIAL HISTORY:  Social History   Tobacco Use  . Smoking status: Unknown  . Smokeless tobacco: Not on file  Substance Use Topics  . Alcohol use: Not on file    FAMILY HISTORY: Family History[3]   Vitals   Vitals:   10/16/23 0722 10/16/23 0724  BP:  121/93  Pulse: 74 63  Resp:  18  Temp:  36.7 C (98.1 F)  SpO2: 100% 100%  Weight:  86.2 kg (190 lb 0.6 oz)      Radiology   No orders to display     Laboratory Data   Lab Results  Component Value Date   WBC 5.0 10/16/2023   HGB 12.6 (L)  10/16/2023   HCT 37.3 (L) 10/16/2023   PLT 224 10/16/2023    Lab Results  Component Value Date   NA 142 10/16/2023   K 3.6 10/16/2023   CL 105 10/16/2023   CO2 21.0 10/16/2023   BUN 8 (L) 10/16/2023   CREATININE 1.04 10/16/2023   GLU 100 10/16/2023   CALCIUM 9.6 10/16/2023   MG 1.8 09/24/2022    Lab Results  Component Value Date   BILITOT 0.9 10/16/2023   BILIDIR 0.30 10/14/2021   PROT 7.3 10/16/2023   ALBUMIN 4.2 10/16/2023   ALT <7 (L) 10/16/2023   AST 12 10/16/2023   ALKPHOS 45 (L) 10/16/2023    No results found for: LABPROT, INR, APTT  Portions of this record have been created using Scientist, clinical (histocompatibility and immunogenetics). Dictation errors have been sought, but may not have been identified and corrected.        [1] No past medical history on file. [2] No past surgical history on file. [3] No family history on file.  Carlo Norleen LABOR,  MD Resident 10/16/23 [id number]

## 2023-11-16 ENCOUNTER — Emergency Department
Admission: EM | Admit: 2023-11-16 | Discharge: 2023-11-16 | Disposition: A | Discharge status: Home / Self Care | Attending: Emergency Medicine | Admitting: Emergency Medicine

## 2023-11-16 ENCOUNTER — Other Ambulatory Visit: Payer: Self-pay

## 2023-11-16 ENCOUNTER — Emergency Department

## 2023-11-16 DIAGNOSIS — R197 Diarrhea, unspecified: Secondary | ICD-10-CM | POA: Diagnosis not present

## 2023-11-16 DIAGNOSIS — R112 Nausea with vomiting, unspecified: Secondary | ICD-10-CM | POA: Diagnosis present

## 2023-11-16 DIAGNOSIS — R1013 Epigastric pain: Secondary | ICD-10-CM | POA: Insufficient documentation

## 2023-11-16 LAB — CBC
HCT: 35.4 % — ABNORMAL LOW (ref 39.0–52.0)
Hemoglobin: 12.1 g/dL — ABNORMAL LOW (ref 13.0–17.0)
MCH: 31.7 pg (ref 26.0–34.0)
MCHC: 34.2 g/dL (ref 30.0–36.0)
MCV: 92.7 fL (ref 80.0–100.0)
Platelets: 216 K/uL (ref 150–400)
RBC: 3.82 MIL/uL — ABNORMAL LOW (ref 4.22–5.81)
RDW: 11.4 % — ABNORMAL LOW (ref 11.5–15.5)
WBC: 7.3 K/uL (ref 4.0–10.5)
nRBC: 0 % (ref 0.0–0.2)

## 2023-11-16 LAB — COMPREHENSIVE METABOLIC PANEL WITH GFR
ALT: 10 U/L (ref 0–44)
AST: 24 U/L (ref 15–41)
Albumin: 4.6 g/dL (ref 3.5–5.0)
Alkaline Phosphatase: 43 U/L (ref 38–126)
Anion gap: 18 — ABNORMAL HIGH (ref 5–15)
BUN: 13 mg/dL (ref 6–20)
CO2: 17 mmol/L — ABNORMAL LOW (ref 22–32)
Calcium: 9.7 mg/dL (ref 8.9–10.3)
Chloride: 103 mmol/L (ref 98–111)
Creatinine, Ser: 1.02 mg/dL (ref 0.61–1.24)
GFR, Estimated: >60 mL/min (ref >60)
Glucose, Bld: 105 mg/dL — ABNORMAL HIGH (ref 70–99)
Potassium: 3.7 mmol/L (ref 3.5–5.1)
Sodium: 138 mmol/L (ref 135–145)
Total Bilirubin: 1.2 mg/dL (ref 0.0–1.2)
Total Protein: 7.8 g/dL (ref 6.5–8.1)

## 2023-11-16 LAB — URINALYSIS, ROUTINE W REFLEX MICROSCOPIC
Bilirubin Urine: NEGATIVE
Glucose, UA: NEGATIVE mg/dL
Hgb urine dipstick: NEGATIVE
Ketones, ur: 80 mg/dL — AB
Leukocytes,Ua: NEGATIVE
Nitrite: NEGATIVE
Protein, ur: 30 mg/dL — AB
Specific Gravity, Urine: 1.026 (ref 1.005–1.030)
pH: 8 (ref 5.0–8.0)

## 2023-11-16 LAB — URINE DRUG SCREEN, QUALITATIVE (ARMC ONLY)
Amphetamines, Ur Screen: NOT DETECTED
Barbiturates, Ur Screen: NOT DETECTED
Benzodiazepine, Ur Scrn: NOT DETECTED
Cannabinoid 50 Ng, Ur ~~LOC~~: POSITIVE — AB
Cocaine Metabolite,Ur ~~LOC~~: NOT DETECTED
MDMA (Ecstasy)Ur Screen: NOT DETECTED
Methadone Scn, Ur: NOT DETECTED
Opiate, Ur Screen: POSITIVE — AB
Phencyclidine (PCP) Ur S: NOT DETECTED
Tricyclic, Ur Screen: NOT DETECTED

## 2023-11-16 LAB — LIPASE, BLOOD: Lipase: 15 U/L (ref 11–51)

## 2023-11-16 MED ORDER — HALOPERIDOL LACTATE 5 MG/ML IJ SOLN
2.0000 mg | Freq: Once | INTRAMUSCULAR | Status: AC
  Administered 2023-11-16: 2 mg via INTRAVENOUS
  Filled 2023-11-16: qty 1

## 2023-11-16 MED ORDER — MORPHINE SULFATE (PF) 4 MG/ML IV SOLN
4.0000 mg | Freq: Once | INTRAVENOUS | Status: AC
  Administered 2023-11-16: 4 mg via INTRAVENOUS
  Filled 2023-11-16: qty 1

## 2023-11-16 MED ORDER — SODIUM CHLORIDE 0.9 % IV BOLUS
1000.0000 mL | Freq: Once | INTRAVENOUS | Status: AC
  Administered 2023-11-16: 1000 mL via INTRAVENOUS

## 2023-11-16 MED ORDER — DEXTROSE 5 % IN LACTATED RINGERS IV BOLUS
1000.0000 mL | Freq: Once | INTRAVENOUS | Status: AC
  Administered 2023-11-16: 1000 mL via INTRAVENOUS

## 2023-11-16 MED ORDER — ONDANSETRON HCL 4 MG/2ML IJ SOLN
4.0000 mg | Freq: Once | INTRAMUSCULAR | Status: AC
  Administered 2023-11-16: 4 mg via INTRAVENOUS
  Filled 2023-11-16: qty 2

## 2023-11-16 MED ORDER — FAMOTIDINE IN NACL 20-0.9 MG/50ML-% IV SOLN
20.0000 mg | Freq: Once | INTRAVENOUS | Status: AC
  Administered 2023-11-16: 20 mg via INTRAVENOUS
  Filled 2023-11-16: qty 50

## 2023-11-16 MED ORDER — DROPERIDOL 2.5 MG/ML IJ SOLN: 1.2500 mg | Freq: Once | INTRAMUSCULAR | Status: DC

## 2023-11-16 MED ORDER — ONDANSETRON 4 MG PO TBDP: 4.0000 mg | ORAL_TABLET | Freq: Three times a day (TID) | ORAL | 0 refills | Status: AC | PRN

## 2023-11-16 MED ORDER — DROPERIDOL 2.5 MG/ML IJ SOLN: 0.6250 mg | Freq: Once | INTRAMUSCULAR | Status: DC

## 2023-11-16 MED ORDER — KETOROLAC TROMETHAMINE 30 MG/ML IJ SOLN
30.0000 mg | Freq: Once | INTRAMUSCULAR | Status: AC
  Administered 2023-11-16: 30 mg via INTRAVENOUS
  Filled 2023-11-16: qty 1

## 2023-11-16 NOTE — ED Provider Notes (Signed)
 [facility] Provider Note    Event Date/Time   First MD Initiated Contact with Patient 11/16/23 1646     (approximate)   History   Emesis   HPI  Ethan Schultz is a 33 y.o. male with history of marijuana induced hyperemesis presents emergency department with severe nausea.  Patient states has had nausea and vomiting and some diarrhea that started about 4 AM this morning.  No sick contacts.  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  Patient has history of appendectomy.  He is complaining of more epigastric and right upper quadrant pain.  Denies fever or chills      Physical Exam   Triage Vital Signs: ED Triage Vitals  Encounter Vitals Group     BP 11/16/23 1532 123/74     Girls Systolic BP Percentile --      Girls Diastolic BP Percentile --      Boys Systolic BP Percentile --      Boys Diastolic BP Percentile --      Pulse Rate 11/16/23 1532 64     Resp 11/16/23 1532 18     Temp 11/16/23 1532 98 F (36.7 C)     Temp Source 11/16/23 1532 Oral     SpO2 11/16/23 1532 98 %     Weight 11/16/23 1530 160 lb (72.6 kg)     Height 11/16/23 1530 5' 9 (1.753 m)     Head Circumference --      Peak Flow --      Pain Score 11/16/23 1530 10     Pain Loc --      Pain Education --      Exclude from Growth Chart --     Most recent vital signs: Vitals:   11/16/23 1532  BP: 123/74  Pulse: 64  Resp: 18  Temp: 98 F (36.7 C)  SpO2: 98%     General: Awake, no distress.   CV:  Good peripheral perfusion.  Resp:  Normal effort. Abd:  No distention. Tender in ruq and epigastric area  Other:      ED Results / Procedures / Treatments   Labs (all labs ordered are listed, but only abnormal results are displayed) Labs Reviewed  COMPREHENSIVE METABOLIC PANEL WITH GFR - Abnormal; Notable for the following components:      Result Value   CO2 17 (*)    Glucose, Bld 105 (*)    Anion gap 18 (*)    All other components within  normal limits  CBC - Abnormal; Notable for the following components:   RBC 3.82 (*)    Hemoglobin 12.1 (*)    HCT 35.4 (*)    RDW 11.4 (*)    All other components within normal limits  URINALYSIS, ROUTINE W REFLEX MICROSCOPIC  LIPASE, BLOOD     EKG  EKG   RADIOLOGY Ultrasound right upper quadrant    PROCEDURES:   Procedures  Critical Care:  no Chief Complaint  Patient presents with   Emesis      MEDICATIONS ORDERED IN ED: Medications  dextrose 5% lactated ringers  bolus 1,000 mL (has no administration in time range)  ketorolac  (TORADOL ) 30 MG/ML injection 30 mg (has no administration in time range)  sodium chloride  0.9 % bolus 1,000 mL (1,000 mLs Intravenous New Bag/Given 11/16/23 1737)  morphine  (PF) 4 MG/ML injection 4 mg (4 mg Intravenous Given 11/16/23 1733)  ondansetron  (ZOFRAN ) injection 4 mg (4 mg Intravenous  Given 11/16/23 1733)     IMPRESSION / MDM / ASSESSMENT AND PLAN / ED COURSE  I reviewed the triage vital signs and the nursing notes.                              Differential diagnosis includes, but is not limited to, hyperemesis secondary to marijuana use, acute cholecystitis, pancreatitis, choledocholithiasis, gastroenteritis  Patient's presentation is most consistent with acute illness / injury with system symptoms.    Medications given: Normal saline 1 L IV, Zofran  4 mg IV, morphine  4 mg IV, D5 LR  I did offer the patient droperidol  but he refuses  CBC metabolic panel shows that he is just slightly acidotic, will switch normal saline to D5 LR 1 L IV  Ultrasound right upper quadrant   Care transferred to [facility], PA-C  In short awaiting radiology results, fluids, UA.  FINAL CLINICAL IMPRESSION(S) / ED DIAGNOSES   Final diagnoses:  Nausea and vomiting, unspecified vomiting type     Rx / DC Orders   ED Discharge Orders     None        Note:  This document was prepared using Dragon voice recognition software and  may include unintentional dictation errors.    Gasper Devere ORN, PA-C 11/16/23 1833    Waymond Lorelle Cummins, MD 11/16/23 [phone]

## 2023-11-16 NOTE — ED Triage Notes (Signed)
 Patient states abdominal pain, N/V/D since 0400; denies known sick contacts.

## 2023-11-16 NOTE — Discharge Instructions (Signed)
 Take your prescription nausea medicine as directed.  Continue with your daily omeprazole.  Follow-up with GI medicine or your primary provider as discussed.

## 2023-11-16 NOTE — ED Provider Notes (Signed)
 ----------------------------------------- 6:39 PM on 11/16/2023 -----------------------------------------  Blood pressure 123/74, pulse 64, temperature 98 F (36.7 C), temperature source Oral, resp. rate 18, height 5' 9 (1.753 m), weight 72.6 kg, SpO2 98%.  Assuming care from Devere Perry, PA-C/NP-C.  In short, Ethan Schultz is a 34 y.o. male with a chief complaint of Emesis .  Refer to the original H&P for additional details.  The current plan of care is to await pending ultrasound, fluid bolus, and IV medication to determine source for upper abdominal pain and emesis.  ____________________________________________    ED Results / Procedures / Treatments   Labs (all labs ordered are listed, but only abnormal results are displayed) Labs Reviewed  COMPREHENSIVE METABOLIC PANEL WITH GFR - Abnormal; Notable for the following components:      Result Value   CO2 17 (*)    Glucose, Bld 105 (*)    Anion gap 18 (*)    All other components within normal limits  CBC - Abnormal; Notable for the following components:   RBC 3.82 (*)    Hemoglobin 12.1 (*)    HCT 35.4 (*)    RDW 11.4 (*)    All other components within normal limits  URINALYSIS, ROUTINE W REFLEX MICROSCOPIC - Abnormal; Notable for the following components:   Color, Urine YELLOW (*)    APPearance CLEAR (*)    Ketones, ur 80 (*)    Protein, ur 30 (*)    Bacteria, UA RARE (*)    All other components within normal limits  URINE DRUG SCREEN, QUALITATIVE ([facility] ONLY) - Abnormal; Notable for the following components:   Opiate, Ur Screen POSITIVE (*)    Cannabinoid 50 Ng, Ur [place] POSITIVE (*)    All other components within normal limits  LIPASE, BLOOD     EKG   RADIOLOGY  I personally viewed and evaluated these images as part of my medical decision making, as well as reviewing the written report by the radiologist.  ED Provider Interpretation: No acute findings on RUQ ultrasound}  US  Abdomen Limited RUQ (LIVER/GB) Result  Date: 11/16/2023 CLINICAL DATA:  [id number] RUQ abdominal pain [id number] EXAM: ULTRASOUND ABDOMEN LIMITED RIGHT UPPER QUADRANT COMPARISON:  None Available. FINDINGS: Gallbladder: No gallstones. No wall thickening or pericholecystic fluid. No sonographic Murphy's sign noted by sonographer. Common bile duct: Diameter: 1.2 mm Liver: Normal echogenicity. No focal lesion identified. No intrahepatic biliary ductal dilation. Portal vein is patent on color Doppler imaging with normal direction of blood flow towards the liver. Right Kidney: Partially visualized. No mass. No hydronephrosis or nephrolithiasis. Other: None. IMPRESSION: No cholecystolithiasis or changes of acute cholecystitis. Electronically Signed   By: Rogelia Myers M.D.   On: 11/16/2023 19:33     PROCEDURES:  Critical Care performed: No  Procedures   MEDICATIONS ORDERED IN ED: Medications  sodium chloride  0.9 % bolus 1,000 mL (0 mLs Intravenous Stopped 11/16/23 1944)  morphine  (PF) 4 MG/ML injection 4 mg (4 mg Intravenous Given 11/16/23 1733)  ondansetron  (ZOFRAN ) injection 4 mg (4 mg Intravenous Given 11/16/23 1733)  dextrose 5% lactated ringers  bolus 1,000 mL (0 mLs Intravenous Stopped 11/16/23 2047)  ketorolac  (TORADOL ) 30 MG/ML injection 30 mg (30 mg Intravenous Given 11/16/23 1838)  famotidine (PEPCID) IVPB 20 mg premix (0 mg Intravenous Stopped 11/16/23 2156)  haloperidol  lactate (HALDOL ) injection 2 mg (2 mg Intravenous Given 11/16/23 2154)     IMPRESSION / MDM / ASSESSMENT AND PLAN / ED COURSE  I reviewed the triage vital signs and the nursing  notes.                              Differential diagnosis includes, but is not limited to, biliary disease (biliary colic, acute cholecystitis, cholangitis, choledocholithiasis, etc), intrathoracic causes for epigastric abdominal pain including ACS, gastritis, duodenitis, pancreatitis, small bowel or large bowel obstruction, abdominal aortic aneurysm, hernia, and ulcer(s).   Patient's  presentation is most consistent with acute complicated illness / injury requiring diagnostic workup.  Patient's diagnosis is consistent with hyperemesis and epigastric pain.  Patient with reassurance and workup at this time.  He required additional IV medicines including famotidine and haloperidol  to ultimately manage his symptoms.  He stable at this time for outpatient management.  UDS does confirm cannabis as well as opioids.  Patient received IV morphine  in the ED.  Patient will be discharged home with prescriptions for Zofran . Patient is to follow up with his PCP or GI medicine as suggested, as needed or otherwise directed. Patient is given ED precautions to return to the ED for any worsening or new symptoms.   FINAL CLINICAL IMPRESSION(S) / ED DIAGNOSES   Final diagnoses:  Nausea and vomiting, unspecified vomiting type  Epigastric pain     Rx / DC Orders   ED Discharge Orders          Ordered    ondansetron  (ZOFRAN -ODT) 4 MG disintegrating tablet  Every 8 hours PRN        11/16/23 2308             Note:  This document was prepared using Dragon voice recognition software and may include unintentional dictation errors.    Loyd Candida LULLA Aldona, PA-C 11/16/23 2311    Waymond Lorelle Cummins, MD 11/18/23 [phone]
